# Patient Record
Sex: Male | Born: 1985 | Race: White | Hispanic: No | Marital: Married | Smoking: Never smoker
Health system: Southern US, Community
[De-identification: ages and names within clinical notes are randomized; demographics above are authoritative.]

## PROBLEM LIST (undated history)

## (undated) DIAGNOSIS — K37 Unspecified appendicitis: Secondary | ICD-10-CM

## (undated) DIAGNOSIS — K219 Gastro-esophageal reflux disease without esophagitis: Secondary | ICD-10-CM

## (undated) DIAGNOSIS — K449 Diaphragmatic hernia without obstruction or gangrene: Secondary | ICD-10-CM

## (undated) DIAGNOSIS — K589 Irritable bowel syndrome without diarrhea: Secondary | ICD-10-CM

## (undated) HISTORY — PX: KNEE SURGERY: SHX244

## (undated) HISTORY — PX: NOSE SURGERY: SHX723

## (undated) HISTORY — PX: APPENDECTOMY: SHX54

## (undated) HISTORY — PX: VASECTOMY: SHX75

---

## 2009-02-03 ENCOUNTER — Emergency Department (HOSPITAL_BASED_OUTPATIENT_CLINIC_OR_DEPARTMENT_OTHER): Admission: EM | Admit: 2009-02-03 | Discharge: 2009-02-03 | Payer: Self-pay | Admitting: Emergency Medicine

## 2009-05-13 ENCOUNTER — Ambulatory Visit: Payer: Self-pay | Admitting: Family Medicine

## 2009-05-13 DIAGNOSIS — R110 Nausea: Secondary | ICD-10-CM | POA: Insufficient documentation

## 2009-05-13 DIAGNOSIS — J029 Acute pharyngitis, unspecified: Secondary | ICD-10-CM | POA: Insufficient documentation

## 2009-08-25 ENCOUNTER — Emergency Department (HOSPITAL_BASED_OUTPATIENT_CLINIC_OR_DEPARTMENT_OTHER): Admission: EM | Admit: 2009-08-25 | Discharge: 2009-08-25 | Payer: Self-pay | Admitting: Emergency Medicine

## 2009-11-19 ENCOUNTER — Ambulatory Visit: Payer: Self-pay | Admitting: Family Medicine

## 2009-11-19 DIAGNOSIS — R519 Headache, unspecified: Secondary | ICD-10-CM | POA: Insufficient documentation

## 2009-11-19 DIAGNOSIS — J039 Acute tonsillitis, unspecified: Secondary | ICD-10-CM

## 2009-11-19 DIAGNOSIS — R51 Headache: Secondary | ICD-10-CM

## 2009-11-20 ENCOUNTER — Encounter: Payer: Self-pay | Admitting: Family Medicine

## 2010-12-03 NOTE — Assessment & Plan Note (Signed)
Summary: HA/TM   Vital Signs:  Patient Profile:   25 Years Old Male CC:      Headache, sore throat x 2 weeks, vomitng x 2 days Height:     74 inches Weight:      220 pounds O2 Sat:      97 % O2 treatment:    Room Air Temp:     97.0 degrees F oral Pulse rate:   88 / minute Pulse rhythm:   regular Resp:     12 per minute BP sitting:   126 / 79  (right arm) Cuff size:   regular  Vitals Entered By: Emilio Math (November 19, 2009 10:17 AM)                  Current Allergies (reviewed today): No known allergies History of Present Illness Chief Complaint: Headache, sore throat x 2 weeks, vomitng x 2 days History of Present Illness: Subjective:  Patient complains of a persistent sore throat for 2 weeks.  He reports a long history of occasional tonsil stones which have become more pronounced during this time.  He has a history of IBS, and over the past two days has had increased nausea without vomiting.  No fevers, chills, and sweats.  He has had sinus congestion with clear mucous drainage but no cough.   Last night he developed a severe frontal/generalized headache which has persisted, but has had no neuro symptoms.  He has had a similar headache about every 2 to 3 days.  His mother has migraine headaches.  He has been under increased stress recently with both parents in the hospital.  REVIEW OF SYSTEMS Constitutional Symptoms      Denies fever, chills, night sweats, weight loss, weight gain, and fatigue.  Eyes       Denies change in vision, eye pain, eye discharge, glasses, contact lenses, and eye surgery. Ear/Nose/Throat/Mouth       Complains of sore throat.      Denies hearing loss/aids, change in hearing, ear pain, ear discharge, dizziness, frequent runny nose, frequent nose bleeds, sinus problems, hoarseness, and tooth pain or bleeding.  Respiratory       Denies dry cough, productive cough, wheezing, shortness of breath, asthma, bronchitis, and emphysema/COPD.  Cardiovascular      Denies murmurs, chest pain, and tires easily with exhertion.    Gastrointestinal       Complains of nausea/vomiting.      Denies stomach pain, diarrhea, constipation, blood in bowel movements, and indigestion. Genitourniary       Denies painful urination, kidney stones, and loss of urinary control. Neurological       Complains of headaches.      Denies paralysis, seizures, and fainting/blackouts. Musculoskeletal       Denies muscle pain, joint pain, joint stiffness, decreased range of motion, redness, swelling, muscle weakness, and gout.  Skin       Denies bruising, unusual mles/lumps or sores, and hair/skin or nail changes.  Psych       Denies mood changes, temper/anger issues, anxiety/stress, speech problems, depression, and sleep problems.  Past History:  Past Medical History: Reviewed history from 05/13/2009 and no changes required. Unremarkable  Past Surgical History: Reviewed history from 05/13/2009 and no changes required. ACL repair  Family History: Reviewed history from 05/13/2009 and no changes required. noncontributory  Social History: Reviewed history from 05/13/2009 and no changes required. Occupation:field work for [employer] Married Former Smoker Alcohol use-no Drug use-no   Objective:  Appearance:  Patient appears healthy, stated age, and in no acute distress  Eyes:  Pupils are equal, round, and reactive to light and accomdation.  Extraocular movement is intact.  Conjunctivae are not inflamed.  Fundi are benign.  No photophobia Ears:  Canals normal.  Tympanic membranes normal.   Nose:  Normal septum.  Normal turbinates, mildly congested.  No sinus tenderness present.  Pharynx:  Erythematous and slightly swollen (worse on right) without obstruction.  Minimal exudate.  Neck:  Supple.  Slightly tender shotty anterior nodes are palpated bilaterally.  Lungs:  Clear to auscultation.  Breath sounds are equal.  Heart:  Regular rate and rhythm without murmurs,  rubs, or gallops.  Abdomen:  Nontender without masses or hepatosplenomegaly.  Bowel sounds are present.  No CVA or flank tenderness.  Neurologic:  Cranial nerves 2 through 12 are normal.  Patellar  reflexes are normal.  Cerebellar function is intact.  Gait and station are normal.  Grip strength symmetric bilaterally.  Assessment New Problems: HEADACHE (ICD-784.0) ACUTE TONSILLITIS (ICD-463)  PERSISTENT TONSILLITIS WITH TONSILLOLITHS PRESENT MIGRAINE HEADACHE  Plan New Medications/Changes: ISOMETHEPTENE-APAP-DICHLORAL 65-325-100 MG CAPS (APAP-ISOMETHEPTENE-DICHLORAL) 1 or 2 by mouth q4hr as needed headache.  Max 8 caps/day  #12 x 1, 11/19/2009, Donna Christen MD AMOXICILLIN 875 MG TABS (AMOXICILLIN) One by mouth two times a day  #20 x 0, 11/19/2009, Donna Christen MD  New Orders: T-Culture, Throat [[id number]] Est. Patient Level IV [09811] Planning Comments:   Patient declines Toradol injection. Begin trial of Midrin.  Rest and increased fluids today. Throat culture pending.  Begin amoxicillin for 10 days. Given info sheet on tonsilloliths.  Recommend ENT evaluation Return for worsening symptoms   The patient and/or caregiver has been counseled thoroughly with regard to medications prescribed including dosage, schedule, interactions, rationale for use, and possible side effects and they verbalize understanding.  Diagnoses and expected course of recovery discussed and will return if not improved as expected or if the condition worsens. Patient and/or caregiver verbalized understanding.  Prescriptions: ISOMETHEPTENE-APAP-DICHLORAL 65-325-100 MG CAPS (APAP-ISOMETHEPTENE-DICHLORAL) 1 or 2 by mouth q4hr as needed headache.  Max 8 caps/day  #12 x 1   Entered and Authorized by:   Donna Christen MD   Signed by:   Donna Christen MD on 11/19/2009   Method used:   Print then Give to Patient   RxID:   [id number] AMOXICILLIN 875 MG TABS (AMOXICILLIN) One by mouth two times a day  #20 x 0    Entered and Authorized by:   Donna Christen MD   Signed by:   Donna Christen MD on 11/19/2009   Method used:   Print then Give to Patient   RxID:   [id number]

## 2010-12-03 NOTE — Letter (Signed)
Summary: Out of Work  [facility]  [address]   [place], [place] [address]   Phone: [phone]  Fax: [phone]    November 19, 2009   Employee:  Theodore Walker    To Whom It May Concern:   For Medical reasons, please excuse the above named employee from work today.    If you need additional information, please feel free to contact our office.         Sincerely,    Donna Christen MD

## 2011-02-06 LAB — URINALYSIS, ROUTINE W REFLEX MICROSCOPIC
Glucose, UA: NEGATIVE mg/dL
Ketones, ur: 15 mg/dL — AB
Leukocytes, UA: NEGATIVE
Nitrite: NEGATIVE
pH: 6 (ref 5.0–8.0)

## 2011-02-06 LAB — CBC
HCT: 39.7 % (ref 39.0–52.0)
MCV: 91.7 fL (ref 78.0–100.0)
Platelets: 236 10*3/uL (ref 150–400)
RDW: 13 % (ref 11.5–15.5)

## 2011-02-06 LAB — URINE MICROSCOPIC-ADD ON

## 2011-02-06 LAB — COMPREHENSIVE METABOLIC PANEL
Albumin: 4.7 g/dL (ref 3.5–5.2)
BUN: 8 mg/dL (ref 6–23)
Creatinine, Ser: 1 mg/dL (ref 0.4–1.5)
Total Bilirubin: 0.7 mg/dL (ref 0.3–1.2)
Total Protein: 7.8 g/dL (ref 6.0–8.3)

## 2011-02-06 LAB — DIFFERENTIAL
Basophils Absolute: 0.3 10*3/uL — ABNORMAL HIGH (ref 0.0–0.1)
Lymphocytes Relative: 11 % — ABNORMAL LOW (ref 12–46)
Monocytes Absolute: 1.5 10*3/uL — ABNORMAL HIGH (ref 0.1–1.0)
Monocytes Relative: 14 % — ABNORMAL HIGH (ref 3–12)
Neutro Abs: 7.4 10*3/uL (ref 1.7–7.7)

## 2011-02-06 LAB — RAPID STREP SCREEN (MED CTR MEBANE ONLY): Streptococcus, Group A Screen (Direct): NEGATIVE

## 2012-04-20 ENCOUNTER — Emergency Department (HOSPITAL_BASED_OUTPATIENT_CLINIC_OR_DEPARTMENT_OTHER)
Admission: EM | Admit: 2012-04-20 | Discharge: 2012-04-20 | Disposition: A | Discharge status: Home / Self Care | Payer: Medicaid Other | Attending: Emergency Medicine | Admitting: Emergency Medicine

## 2012-04-20 ENCOUNTER — Encounter (HOSPITAL_BASED_OUTPATIENT_CLINIC_OR_DEPARTMENT_OTHER): Payer: Self-pay | Admitting: *Deleted

## 2012-04-20 ENCOUNTER — Emergency Department (HOSPITAL_BASED_OUTPATIENT_CLINIC_OR_DEPARTMENT_OTHER): Payer: Medicaid Other

## 2012-04-20 DIAGNOSIS — S8990XA Unspecified injury of unspecified lower leg, initial encounter: Secondary | ICD-10-CM | POA: Insufficient documentation

## 2012-04-20 DIAGNOSIS — Y998 Other external cause status: Secondary | ICD-10-CM | POA: Insufficient documentation

## 2012-04-20 DIAGNOSIS — S99929A Unspecified injury of unspecified foot, initial encounter: Secondary | ICD-10-CM | POA: Insufficient documentation

## 2012-04-20 DIAGNOSIS — Y9389 Activity, other specified: Secondary | ICD-10-CM | POA: Insufficient documentation

## 2012-04-20 DIAGNOSIS — M25462 Effusion, left knee: Secondary | ICD-10-CM

## 2012-04-20 DIAGNOSIS — K219 Gastro-esophageal reflux disease without esophagitis: Secondary | ICD-10-CM | POA: Insufficient documentation

## 2012-04-20 DIAGNOSIS — IMO0002 Reserved for concepts with insufficient information to code with codable children: Secondary | ICD-10-CM | POA: Insufficient documentation

## 2012-04-20 HISTORY — DX: Gastro-esophageal reflux disease without esophagitis: K21.9

## 2012-04-20 MED ORDER — PROMETHAZINE HCL 25 MG PO TABS: 25.0000 mg | ORAL_TABLET | Freq: Four times a day (QID) | ORAL | Status: DC | PRN

## 2012-04-20 MED ORDER — OXYCODONE-ACETAMINOPHEN 5-325 MG PO TABS: 2.0000 | ORAL_TABLET | ORAL | Status: AC | PRN

## 2012-04-20 MED ORDER — IBUPROFEN 800 MG PO TABS
800.0000 mg | ORAL_TABLET | Freq: Once | ORAL | Status: AC
  Administered 2012-04-20: 800 mg via ORAL
  Filled 2012-04-20: qty 1

## 2012-04-20 NOTE — ED Provider Notes (Signed)
History     CSN: [id number]  Arrival date & time 04/20/12  2725   First MD Initiated Contact with Patient 04/20/12 1931      Chief Complaint  Patient presents with  . Knee Injury    (Consider location/radiation/quality/duration/timing/severity/associated sxs/prior treatment) Patient is a 26 y.o. male presenting with knee pain. The history is provided by the patient. No language interpreter was used.  Knee Pain This is a new problem. The current episode started today. The problem occurs constantly. The problem has been unchanged. Associated symptoms include joint swelling and myalgias. The symptoms are aggravated by bending and walking. He has tried nothing for the symptoms. The treatment provided no relief.  Pt reports he turned a golf cart over on to his knee.   Pt complains of swelling and pain  Past Medical History  Diagnosis Date  . GERD (gastroesophageal reflux disease)     History reviewed. No pertinent past surgical history.  History reviewed. No pertinent family history.  History  Substance Use Topics  . Smoking status: Not on file  . Smokeless tobacco: Current User  . Alcohol Use: No      Review of Systems  Musculoskeletal: Positive for myalgias and joint swelling.  All other systems reviewed and are negative.    Allergies  Morphine and related  Home Medications  No current outpatient prescriptions on file.  BP 124/61  Pulse 65  Temp 97.9 F (36.6 C) (Oral)  Resp 16  Ht 6\' 3"  (1.905 m)  Wt 240 lb (108.863 kg)  BMI 30.00 kg/m2  SpO2 100%  Physical Exam  Nursing note and vitals reviewed. Constitutional: He is oriented to person, place, and time. He appears well-developed and well-nourished.  Musculoskeletal: He exhibits edema and tenderness.       Swollen tender left knee,  Decreased range of motion,  nv and ns intact  Neurological: He is alert and oriented to person, place, and time. He has normal reflexes.  Skin: Skin is warm.  Psychiatric:  He has a normal mood and affect.    ED Course  Procedures (including critical care time)  Labs Reviewed - No data to display Dg Knee Complete 4 Views Left  04/20/2012  *RADIOLOGY REPORT*  Clinical Data: Injured left knee.  History of left knee surgery x2.  LEFT KNEE - COMPLETE 4+ VIEW  Comparison: None.  Findings: No evidence of acute or subacute fracture or dislocation. Well-preserved joint spaces.  Well-circumscribed lesion in the lateral aspect of the distal femoral metaphysis.  No other intrinsic osseous abnormality.  Small joint effusion suspected.  IMPRESSION: No acute osseous abnormality.  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  Small joint effusion suspected.  Original Report Authenticated By: Arnell Sieving, M.D.     No diagnosis found.    MDM  Pt placed in a knee imbolizer,  Pt given rx for percocet I advised follow up with dr. Rennis Chris for evaluation        Elson Areas, [place] 04/20/12 2032

## 2012-04-20 NOTE — Discharge Instructions (Signed)

## 2012-04-20 NOTE — ED Notes (Signed)
Pt c/o left knee pain after injury this am

## 2012-04-21 NOTE — ED Provider Notes (Signed)
Medical screening examination/treatment/procedure(s) were performed by non-physician practitioner and as supervising physician I was immediately available for consultation/collaboration.   Eyla Tallon, MD 04/21/12 2312 

## 2012-04-26 ENCOUNTER — Other Ambulatory Visit: Payer: Self-pay | Admitting: Orthopedic Surgery

## 2012-04-26 DIAGNOSIS — M25559 Pain in unspecified hip: Secondary | ICD-10-CM

## 2012-04-28 ENCOUNTER — Inpatient Hospital Stay: Admission: RE | Admit: 2012-04-28 | Payer: Self-pay | Source: Ambulatory Visit

## 2012-04-28 ENCOUNTER — Other Ambulatory Visit: Payer: Self-pay

## 2012-05-22 ENCOUNTER — Encounter (HOSPITAL_BASED_OUTPATIENT_CLINIC_OR_DEPARTMENT_OTHER): Payer: Self-pay | Admitting: *Deleted

## 2012-05-22 ENCOUNTER — Emergency Department (HOSPITAL_BASED_OUTPATIENT_CLINIC_OR_DEPARTMENT_OTHER): Payer: Managed Care, Other (non HMO)

## 2012-05-22 ENCOUNTER — Emergency Department (HOSPITAL_BASED_OUTPATIENT_CLINIC_OR_DEPARTMENT_OTHER)
Admission: EM | Admit: 2012-05-22 | Discharge: 2012-05-22 | Disposition: A | Discharge status: Home / Self Care | Payer: Managed Care, Other (non HMO) | Attending: Emergency Medicine | Admitting: Emergency Medicine

## 2012-05-22 DIAGNOSIS — R599 Enlarged lymph nodes, unspecified: Secondary | ICD-10-CM | POA: Insufficient documentation

## 2012-05-22 DIAGNOSIS — K625 Hemorrhage of anus and rectum: Secondary | ICD-10-CM | POA: Insufficient documentation

## 2012-05-22 DIAGNOSIS — K589 Irritable bowel syndrome without diarrhea: Secondary | ICD-10-CM | POA: Insufficient documentation

## 2012-05-22 DIAGNOSIS — K219 Gastro-esophageal reflux disease without esophagitis: Secondary | ICD-10-CM | POA: Insufficient documentation

## 2012-05-22 DIAGNOSIS — R591 Generalized enlarged lymph nodes: Secondary | ICD-10-CM

## 2012-05-22 HISTORY — DX: Diaphragmatic hernia without obstruction or gangrene: K44.9

## 2012-05-22 HISTORY — DX: Irritable bowel syndrome, unspecified: K58.9

## 2012-05-22 LAB — CBC WITH DIFFERENTIAL/PLATELET
Basophils Relative: 1 % (ref 0–1)
Eosinophils Absolute: 0.4 10*3/uL (ref 0.0–0.7)
Eosinophils Relative: 4 % (ref 0–5)
Hemoglobin: 13.2 g/dL (ref 13.0–17.0)
Lymphs Abs: 2.1 10*3/uL (ref 0.7–4.0)
MCH: 30.8 pg (ref 26.0–34.0)
MCHC: 35.2 g/dL (ref 30.0–36.0)
MCV: 87.6 fL (ref 78.0–100.0)
Monocytes Relative: 9 % (ref 3–12)
RBC: 4.28 MIL/uL (ref 4.22–5.81)

## 2012-05-22 LAB — COMPREHENSIVE METABOLIC PANEL
BUN: 7 mg/dL (ref 6–23)
Calcium: 9.6 mg/dL (ref 8.4–10.5)
Creatinine, Ser: 1 mg/dL (ref 0.50–1.35)
GFR calc Af Amer: >90 mL/min (ref >90)
Glucose, Bld: 70 mg/dL (ref 70–99)
Total Protein: 7.7 g/dL (ref 6.0–8.3)

## 2012-05-22 MED ORDER — ONDANSETRON HCL 4 MG/2ML IJ SOLN
4.0000 mg | Freq: Once | INTRAMUSCULAR | Status: AC
  Administered 2012-05-22: 4 mg via INTRAVENOUS
  Filled 2012-05-22: qty 2

## 2012-05-22 MED ORDER — HYDROMORPHONE HCL PF 1 MG/ML IJ SOLN
1.0000 mg | Freq: Once | INTRAMUSCULAR | Status: AC
  Administered 2012-05-22: 1 mg via INTRAVENOUS
  Filled 2012-05-22: qty 1

## 2012-05-22 MED ORDER — IOHEXOL 300 MG/ML  SOLN: 20.0000 mL | INTRAMUSCULAR | Status: DC

## 2012-05-22 MED ORDER — GI COCKTAIL ~~LOC~~
30.0000 mL | Freq: Once | ORAL | Status: AC
  Administered 2012-05-22: 30 mL via ORAL
  Filled 2012-05-22: qty 30

## 2012-05-22 MED ORDER — HYDROCODONE-ACETAMINOPHEN 5-500 MG PO TABS: 1.0000 | ORAL_TABLET | Freq: Four times a day (QID) | ORAL | Status: AC | PRN

## 2012-05-22 MED ORDER — IOHEXOL 300 MG/ML  SOLN
100.0000 mL | Freq: Once | INTRAMUSCULAR | Status: AC | PRN
  Administered 2012-05-22: 100 mL via INTRAVENOUS

## 2012-05-22 NOTE — ED Provider Notes (Signed)
History     CSN: [id number]  Arrival date & time 05/22/12  1428   First MD Initiated Contact with Patient 05/22/12 1449      Chief Complaint  Patient presents with  . Rectal Bleeding    (Consider location/radiation/quality/duration/timing/severity/associated sxs/prior treatment) Patient is a 26 y.o. male presenting with hematochezia. The history is provided by the patient. No language interpreter was used.  Rectal Bleeding  The current episode started today. The onset was gradual. The problem occurs occasionally. The problem has been unchanged. The pain is moderate. The stool is described as liquid and bloody. Pertinent negatives include no fever and no nausea.    Past Medical History  Diagnosis Date  . GERD (gastroesophageal reflux disease)   . IBS (irritable bowel syndrome)   . Hiatal hernia   . Spastic colon     Past Surgical History  Procedure Date  . Knee surgery   . Nose surgery   . Vasectomy     History reviewed. No pertinent family history.  History  Substance Use Topics  . Smoking status: Not on file  . Smokeless tobacco: Current User  . Alcohol Use: No      Review of Systems  Constitutional: Negative for fever.  Respiratory: Negative.   Cardiovascular: Negative.   Gastrointestinal: Positive for hematochezia. Negative for nausea.  Neurological: Negative.     Allergies  Morphine and related  Home Medications   Current Outpatient Rx  Name Route Sig Dispense Refill  . OMEPRAZOLE 20 MG PO CPDR Oral Take 40 mg by mouth daily.    Marland Kitchen PROMETHAZINE HCL 25 MG PO TABS Oral Take 1 tablet (25 mg total) by mouth every 6 (six) hours as needed for nausea. 20 tablet 0    BP 125/62  Pulse 78  Temp 97.5 F (36.4 C) (Oral)  Resp 20  Ht 6\' 3"  (1.905 m)  Wt 230 lb (104.327 kg)  BMI 28.75 kg/m2  SpO2 100%  Physical Exam  Nursing note and vitals reviewed. Constitutional: He is oriented to person, place, and time. He appears well-developed and  well-nourished.  HENT:  Head: Atraumatic.  Eyes: Conjunctivae and EOM are normal.  Neck: Neck supple.  Cardiovascular: Normal rate and regular rhythm.   Pulmonary/Chest: Effort normal and breath sounds normal.  Abdominal: Soft. Bowel sounds are normal.       Lower abdominal tenderness  Genitourinary:       Bright red blood  Musculoskeletal: Normal range of motion.  Neurological: He is alert and oriented to person, place, and time.  Skin: Skin is warm and dry.  Psychiatric: He has a normal mood and affect.    ED Course  Procedures (including critical care time)  Labs Reviewed  CBC WITH DIFFERENTIAL - Abnormal; Notable for the following:    HCT 37.5 (*)     All other components within normal limits  COMPREHENSIVE METABOLIC PANEL - Abnormal; Notable for the following:    Total Bilirubin 0.2 (*)     All other components within normal limits  OCCULT BLOOD X 1 CARD TO LAB, STOOL   Ct Abdomen Pelvis W Contrast  05/22/2012  *RADIOLOGY REPORT*  Clinical Data: Rectal bleeding, abdominal pain  CT ABDOMEN AND PELVIS WITH CONTRAST  Technique:  Multidetector CT imaging of the abdomen and pelvis was performed following the standard protocol during bolus administration of intravenous contrast.  Contrast: 1 OMNIPAQUE IOHEXOL 300 MG/ML  SOLN, OMNIPAQUE IOHEXOL 300 MG/ML  SOLN  Comparison: None.  Findings: Lung bases  are clear.  Small hiatal hernia.  Two associated paraesophageal lymph nodes near the GE junction measuring 12 mm (series 2/images 1 and 7).  Liver, spleen, pancreas, and adrenal glands are within normal limits.  Gallbladder is unremarkable.  No intrahepatic or extrahepatic ductal dilatation.  Kidneys are within normal limits.  No hydronephrosis.  No evidence of bowel obstruction.  Normal appendix.  Mild sigmoid diverticulosis.  No colonic wall thickening or inflammatory changes.  No evidence of abdominal aortic aneurysm.  No abdominopelvic ascites.  No suspicious abdominopelvic  lymphadenopathy.  Prostate is unremarkable.  Bladder is within normal limits.  Visualized osseous structures are within normal limits.  IMPRESSION: Mild sigmoid diverticulosis.  No colonic wall thickening or inflammatory changes.  No evidence of bowel obstruction.  Normal appendix.  Small hiatal hernia.  Two associated 12 mm paraesophageal lymph nodes near the GE junction.  Consider upper endoscopy for further evaluation.  Original Report Authenticated By: Charline Bills, M.D.     1. Rectal bleeding   2. Lymphadenopathy       MDM  Discussed findings of ctwith pt:pt blood not symptomatic with bleeding hgb normal:feel like pt is safe to follow up with his gi doctor in [place], [place] 05/22/12 [phone]

## 2012-05-22 NOTE — ED Notes (Addendum)
Pt describes abd pain and bright red bleeding from rectum when he uses the bathroom "alot" Denies itching or other s/s. Hx IBS and spastic colon.

## 2012-05-23 NOTE — ED Provider Notes (Signed)
Medical screening examination/treatment/procedure(s) were performed by non-physician practitioner and as supervising physician I was immediately available for consultation/collaboration.   Gwyneth Sprout, MD 05/23/12 [phone]

## 2012-08-28 ENCOUNTER — Emergency Department (HOSPITAL_BASED_OUTPATIENT_CLINIC_OR_DEPARTMENT_OTHER): Payer: Managed Care, Other (non HMO)

## 2012-08-28 ENCOUNTER — Encounter (HOSPITAL_BASED_OUTPATIENT_CLINIC_OR_DEPARTMENT_OTHER): Payer: Self-pay | Admitting: *Deleted

## 2012-08-28 ENCOUNTER — Emergency Department (HOSPITAL_BASED_OUTPATIENT_CLINIC_OR_DEPARTMENT_OTHER)
Admission: EM | Admit: 2012-08-28 | Discharge: 2012-08-28 | Disposition: A | Discharge status: Home / Self Care | Payer: Managed Care, Other (non HMO) | Attending: Emergency Medicine | Admitting: Emergency Medicine

## 2012-08-28 DIAGNOSIS — Z79899 Other long term (current) drug therapy: Secondary | ICD-10-CM | POA: Insufficient documentation

## 2012-08-28 DIAGNOSIS — S46919A Strain of unspecified muscle, fascia and tendon at shoulder and upper arm level, unspecified arm, initial encounter: Secondary | ICD-10-CM

## 2012-08-28 DIAGNOSIS — IMO0002 Reserved for concepts with insufficient information to code with codable children: Secondary | ICD-10-CM | POA: Insufficient documentation

## 2012-08-28 DIAGNOSIS — Y9389 Activity, other specified: Secondary | ICD-10-CM | POA: Insufficient documentation

## 2012-08-28 DIAGNOSIS — F17200 Nicotine dependence, unspecified, uncomplicated: Secondary | ICD-10-CM | POA: Insufficient documentation

## 2012-08-28 DIAGNOSIS — Y929 Unspecified place or not applicable: Secondary | ICD-10-CM | POA: Insufficient documentation

## 2012-08-28 DIAGNOSIS — Z8719 Personal history of other diseases of the digestive system: Secondary | ICD-10-CM | POA: Insufficient documentation

## 2012-08-28 DIAGNOSIS — R296 Repeated falls: Secondary | ICD-10-CM | POA: Insufficient documentation

## 2012-08-28 MED ORDER — OXYCODONE-ACETAMINOPHEN 5-325 MG PO TABS
2.0000 | ORAL_TABLET | Freq: Once | ORAL | Status: AC
  Administered 2012-08-28: 2 via ORAL
  Filled 2012-08-28 (×2): qty 2

## 2012-08-28 MED ORDER — OXYCODONE-ACETAMINOPHEN 5-325 MG PO TABS: 2.0000 | ORAL_TABLET | ORAL | Status: DC | PRN

## 2012-08-28 NOTE — ED Notes (Signed)
Pt presents to ED today without indwelling IV catheter.  Not charted as removed from previous EPIC encounter.  

## 2012-08-28 NOTE — ED Notes (Signed)
Pt has decreased movement to right shoulder after fall.  Pain increased with palpation.  Pt has no obvious deformities noted.

## 2012-08-28 NOTE — ED Notes (Signed)
Pt presents to ED today with right shoulder injury after playing with kids and falling fron standing landing on tile/

## 2012-08-28 NOTE — ED Provider Notes (Signed)
History     CSN: [id number]  Arrival date & time 08/28/12  7829   First MD Initiated Contact with Patient 08/28/12 1004      Chief Complaint  Patient presents with  . Shoulder Injury    (Consider location/radiation/quality/duration/timing/severity/associated sxs/prior treatment) HPI Comments: Patient presents with pain to his right shoulder that he sustained after an injury yesterday. He's had a constant throbbing pain in his right shoulder since then. He states he was playing with his kids and he fell over or leaning directly on his right shoulder. He has no history of prior injuries to the shoulder. Denies any numbness or weakness in his hand. Denies any other injuries. No neck or back pain. Denies any head injury. He's taking over-the-counter medicines without relief.  Patient is a 26 y.o. male presenting with shoulder injury. The history is provided by the patient.  Shoulder Injury Pertinent negatives include no chest pain, no headaches and no shortness of breath.    Past Medical History  Diagnosis Date  . GERD (gastroesophageal reflux disease)   . IBS (irritable bowel syndrome)   . Hiatal hernia   . Spastic colon     Past Surgical History  Procedure Date  . Knee surgery   . Nose surgery   . Vasectomy     History reviewed. No pertinent family history.  History  Substance Use Topics  . Smoking status: Not on file  . Smokeless tobacco: Current User  . Alcohol Use: No      Review of Systems  Constitutional: Negative for fatigue.  HENT: Negative for neck pain.   Eyes: Negative.   Respiratory: Negative for shortness of breath.   Cardiovascular: Negative for chest pain.  Gastrointestinal: Negative for nausea and vomiting.  Genitourinary: Negative for frequency, hematuria, flank pain and difficulty urinating.  Musculoskeletal: Negative for back pain.  Skin: Negative for rash.  Neurological: Negative for weakness, numbness and headaches.    Allergies    Morphine and related  Home Medications   Current Outpatient Rx  Name Route Sig Dispense Refill  . OXYCODONE-ACETAMINOPHEN 5-325 MG PO TABS Oral Take 2 tablets by mouth every 4 (four) hours as needed for pain. 15 tablet 0  . PROMETHAZINE HCL 25 MG PO TABS Oral Take 1 tablet (25 mg total) by mouth every 6 (six) hours as needed for nausea. 20 tablet 0    BP 122/72  Pulse 80  Temp 98.2 F (36.8 C) (Oral)  Resp 14  SpO2 100%  Physical Exam  Constitutional: He is oriented to person, place, and time. He appears well-developed and well-nourished.  HENT:  Head: Normocephalic and atraumatic.  Neck: Normal range of motion. Neck supple.       No pain to neck or back  Musculoskeletal: He exhibits edema and tenderness.       He has moderate tenderness to the anterior and posterior right shoulder. He also has some tenderness around the a.c. joint. He has pain on any range of motion of the shoulder of the shoulder joint. Denies any pain to the elbow or the hand. He has normal sensation in the hand. Normal motor function in the hand. Radial pulses are intact. No deformity or swelling is noted.  Neurological: He is alert and oriented to person, place, and time.    ED Course  Procedures (including critical care time)  Dg Shoulder Right  08/28/2012  *RADIOLOGY REPORT*  Clinical Data: Right shoulder injury.  Pain.  Limited range of motion.  RIGHT SHOULDER -  2+ VIEW  Comparison: None available.  Findings: The right shoulder is located.  No acute bone or soft tissue abnormality is present.  The right hemithorax is clear.  IMPRESSION: Negative right shoulder.   Original Report Authenticated By: Jamesetta Orleans. MATTERN, M.D.       1. Shoulder strain       MDM  There is no evidence of fracture or dislocation. Patient is given a shoulder sling to use it was advised to take his are not periodically and do range of motion exercises. Was given a prescription for Percocet for pain. He was given a  referral to follow up with an orthopedist if his symptoms are not improving or return here as needed for any worsening symptoms        Rolan Bucco, MD 08/28/12 1324

## 2012-09-22 ENCOUNTER — Encounter (HOSPITAL_BASED_OUTPATIENT_CLINIC_OR_DEPARTMENT_OTHER): Payer: Self-pay | Admitting: Emergency Medicine

## 2012-09-22 ENCOUNTER — Emergency Department (HOSPITAL_BASED_OUTPATIENT_CLINIC_OR_DEPARTMENT_OTHER)
Admission: EM | Admit: 2012-09-22 | Discharge: 2012-09-22 | Disposition: A | Discharge status: Home / Self Care | Payer: Medicaid Other | Attending: Emergency Medicine | Admitting: Emergency Medicine

## 2012-09-22 ENCOUNTER — Emergency Department (HOSPITAL_BASED_OUTPATIENT_CLINIC_OR_DEPARTMENT_OTHER): Payer: Medicaid Other

## 2012-09-22 DIAGNOSIS — Z8719 Personal history of other diseases of the digestive system: Secondary | ICD-10-CM | POA: Insufficient documentation

## 2012-09-22 DIAGNOSIS — R1031 Right lower quadrant pain: Secondary | ICD-10-CM | POA: Insufficient documentation

## 2012-09-22 DIAGNOSIS — R109 Unspecified abdominal pain: Secondary | ICD-10-CM

## 2012-09-22 DIAGNOSIS — G8929 Other chronic pain: Secondary | ICD-10-CM | POA: Insufficient documentation

## 2012-09-22 HISTORY — DX: Unspecified appendicitis: K37

## 2012-09-22 LAB — BASIC METABOLIC PANEL
CO2: 26 mEq/L (ref 19–32)
Chloride: 99 mEq/L (ref 96–112)
Creatinine, Ser: 0.9 mg/dL (ref 0.50–1.35)

## 2012-09-22 LAB — CBC WITH DIFFERENTIAL/PLATELET
Basophils Absolute: 0.1 10*3/uL (ref 0.0–0.1)
HCT: 39.7 % (ref 39.0–52.0)
Hemoglobin: 14.1 g/dL (ref 13.0–17.0)
Lymphocytes Relative: 29 % (ref 12–46)
Lymphs Abs: 2.7 10*3/uL (ref 0.7–4.0)
Monocytes Absolute: 0.7 10*3/uL (ref 0.1–1.0)
Monocytes Relative: 8 % (ref 3–12)
Neutro Abs: 5.6 10*3/uL (ref 1.7–7.7)
RBC: 4.64 MIL/uL (ref 4.22–5.81)
RDW: 12.3 % (ref 11.5–15.5)
WBC: 9.2 10*3/uL (ref 4.0–10.5)

## 2012-09-22 MED ORDER — HYDROMORPHONE HCL PF 1 MG/ML IJ SOLN
1.0000 mg | Freq: Once | INTRAMUSCULAR | Status: AC
  Administered 2012-09-22: 1 mg via INTRAVENOUS
  Filled 2012-09-22: qty 1

## 2012-09-22 MED ORDER — ONDANSETRON HCL 4 MG/2ML IJ SOLN
4.0000 mg | Freq: Once | INTRAMUSCULAR | Status: AC
  Administered 2012-09-22: 4 mg via INTRAVENOUS
  Filled 2012-09-22: qty 2

## 2012-09-22 MED ORDER — IOHEXOL 300 MG/ML  SOLN: 25.0000 mL | INTRAMUSCULAR | Status: DC

## 2012-09-22 MED ORDER — IOHEXOL 300 MG/ML  SOLN
100.0000 mL | Freq: Once | INTRAMUSCULAR | Status: AC | PRN
  Administered 2012-09-22: 100 mL via INTRAVENOUS

## 2012-09-22 NOTE — ED Notes (Signed)
Patient transported to CT 

## 2012-09-22 NOTE — ED Notes (Signed)
MD at bedside. 

## 2012-09-22 NOTE — ED Provider Notes (Signed)
History     CSN: [id number]  Arrival date & time 09/22/12  1478   First MD Initiated Contact with Patient 09/22/12 1845      Chief Complaint  Patient presents with  . Abdominal Pain    (Consider location/radiation/quality/duration/timing/severity/associated sxs/prior treatment) HPI Pt with history of IBS is approx a week s/p laparoscopic appendectomy. Reports continued severe aching RLQ pain and vomiting not improved with pain medications at home. He denies any fever, no constipation, no dysuria or hematuria. He was advised by his Surgeon at [facility] to come to the ED for evaluation.   Past Medical History  Diagnosis Date  . GERD (gastroesophageal reflux disease)   . IBS (irritable bowel syndrome)   . Hiatal hernia   . Spastic colon   . Appendicitis     Past Surgical History  Procedure Date  . Knee surgery   . Nose surgery   . Vasectomy   . Appendectomy     No family history on file.  History  Substance Use Topics  . Smoking status: Never Smoker   . Smokeless tobacco: Current User    Types: Chew  . Alcohol Use: No      Review of Systems All other systems reviewed and are negative except as noted in HPI.   Allergies  Morphine and related  Home Medications   Current Outpatient Rx  Name  Route  Sig  Dispense  Refill  . HYDROCODONE-ACETAMINOPHEN 5-325 MG PO TABS   Oral   Take 2 tablets by mouth every 6 (six) hours as needed.         . OXYCODONE-ACETAMINOPHEN 5-325 MG PO TABS   Oral   Take 2 tablets by mouth every 4 (four) hours as needed for pain.   15 tablet   0   . PROMETHAZINE HCL 25 MG PO TABS   Oral   Take 1 tablet (25 mg total) by mouth every 6 (six) hours as needed for nausea.   20 tablet   0     BP 123/84  Pulse 94  Temp 97.9 F (36.6 C) (Oral)  Resp 16  Ht 6\' 3"  (1.905 m)  Wt 230 lb (104.327 kg)  BMI 28.75 kg/m2  SpO2 98%  Physical Exam  Nursing note and vitals reviewed. Constitutional: He is oriented to person,  place, and time. He appears well-developed and well-nourished.  HENT:  Head: Normocephalic and atraumatic.  Eyes: EOM are normal. Pupils are equal, round, and reactive to light.  Neck: Normal range of motion. Neck supple.  Cardiovascular: Normal rate, normal heart sounds and intact distal pulses.   Pulmonary/Chest: Effort normal and breath sounds normal.  Abdominal: Bowel sounds are normal. He exhibits no distension. There is tenderness (RLQ). There is guarding. There is no rebound.  Musculoskeletal: Normal range of motion. He exhibits no edema and no tenderness.  Neurological: He is alert and oriented to person, place, and time. He has normal strength. No cranial nerve deficit or sensory deficit.  Skin: Skin is warm and dry. No rash noted.  Psychiatric: He has a normal mood and affect.    ED Course  Procedures (including critical care time)  Labs Reviewed  CBC WITH DIFFERENTIAL - Abnormal; Notable for the following:    Platelets 405 (*)     All other components within normal limits  BASIC METABOLIC PANEL  URINALYSIS, ROUTINE W REFLEX MICROSCOPIC   Ct Abdomen Pelvis W Contrast  09/22/2012  *RADIOLOGY REPORT*  Clinical Data: Pain post appendectomy.  CT  ABDOMEN AND PELVIS WITH CONTRAST  Technique:  Multidetector CT imaging of the abdomen and pelvis was performed following the standard protocol during bolus administration of intravenous contrast.  Contrast: OMNIPAQUE IOHEXOL 300 MG/ML  SOLN  Comparison: 05/22/2012  Findings: Visualized lung bases are clear.  Unremarkable liver, nondilated gallbladder, spleen, adrenal glands, kidneys, pancreas, aorta.  Stomach is physiologically distended. Small bowel unremarkable.  Surgical staples near the base of the cecum.  No evidence of abscess.  The colon is nondilated, unremarkable. Urinary bladder incompletely distended.  No ascites.  No free air. No adenopathy.  Portal vein patent.  Regional bones unremarkable.  IMPRESSION:  1.  Negative for  abscess or other acute abnormality post appendectomy.   Original Report Authenticated By: D. Andria Rhein, MD      No diagnosis found.    MDM  Will recheck CT to eval for post-op abscess formation. Labs and IVF.    8:14 PM Labs and imaging unremarkable. Review of the [place] Controlled Substance database shows multiple Rx totaling over 110 tabs in the last month from various ED providers in the region as well as his surgeon from 4 days ago. Advised to followup with his surgeon or PCP for any additional pain medications requirements. No additional Rx from the ED.     Emric Kowalewski B. Bernette Mayers, MD 09/22/12 2020

## 2012-09-22 NOTE — ED Notes (Addendum)
Appendectomy 1 wk ago. DC from [facility] days ago.  Pain controlled well in [place].  Norco tabs at home not helping pain. Pt also reports occ vomiting.  Surgeon said to come to ED if it did not get better.

## 2012-10-16 ENCOUNTER — Encounter: Payer: Self-pay | Admitting: *Deleted

## 2012-10-16 ENCOUNTER — Emergency Department (INDEPENDENT_AMBULATORY_CARE_PROVIDER_SITE_OTHER)
Admission: EM | Admit: 2012-10-16 | Discharge: 2012-10-16 | Disposition: A | Discharge status: Home / Self Care | Payer: Managed Care, Other (non HMO) | Source: Home / Self Care | Attending: Family Medicine | Admitting: Family Medicine

## 2012-10-16 DIAGNOSIS — H6190 Disorder of external ear, unspecified, unspecified ear: Secondary | ICD-10-CM

## 2012-10-16 DIAGNOSIS — J209 Acute bronchitis, unspecified: Secondary | ICD-10-CM

## 2012-10-16 MED ORDER — DOXYCYCLINE HYCLATE 100 MG PO CAPS: 100.0000 mg | ORAL_CAPSULE | Freq: Two times a day (BID) | ORAL | Status: DC

## 2012-10-16 MED ORDER — BENZONATATE 200 MG PO CAPS: 200.0000 mg | ORAL_CAPSULE | Freq: Every day | ORAL | Status: DC

## 2012-10-16 NOTE — ED Provider Notes (Signed)
History     CSN: [id number]  Arrival date & time 10/16/12  1121   First MD Initiated Contact with Patient 10/16/12 1156      Chief Complaint  Patient presents with  . Knots on left ear       HPI Comments: Patient presents with two complaints: 1)  He states that he had his ears pierced about 2 years ago but has not used ear-rings for quite a while.  About two days ago he developed a tender nodule with a small amount of bloody purulent drainage from upper piercing on left ear.  He has also noticed a smaller, nontender nodule at the piercing of his left ear lobe.  2)  He complains of a non-productive cough for 5 days, without sore throat or nasal congestion.  No fevers, chills, and sweats.  He feels well otherwise.  His Tdap is current.  The history is provided by the patient.    Past Medical History  Diagnosis Date  . GERD (gastroesophageal reflux disease)   . IBS (irritable bowel syndrome)   . Hiatal hernia   . Spastic colon   . Appendicitis     Past Surgical History  Procedure Date  . Knee surgery   . Nose surgery   . Vasectomy   . Appendectomy     History reviewed. No pertinent family history.  History  Substance Use Topics  . Smoking status: Never Smoker   . Smokeless tobacco: Current User    Types: Chew  . Alcohol Use: No      Review of Systems  Constitutional: Negative.   HENT: Negative for congestion, sore throat and rhinorrhea.   Eyes: Negative.   Respiratory: Positive for cough. Negative for shortness of breath and wheezing.   Cardiovascular: Negative.   Gastrointestinal: Negative.   Genitourinary: Negative.   Musculoskeletal: Negative.   Neurological: Negative.     Allergies  Morphine and related  Home Medications   Current Outpatient Rx  Name  Route  Sig  Dispense  Refill  . BENZONATATE 200 MG PO CAPS   Oral   Take 1 capsule (200 mg total) by mouth at bedtime. Take as needed for cough   12 capsule   0   . DOXYCYCLINE HYCLATE 100 MG  PO CAPS   Oral   Take 1 capsule (100 mg total) by mouth 2 (two) times daily.   20 capsule   0   . HYDROCODONE-ACETAMINOPHEN 5-325 MG PO TABS   Oral   Take 2 tablets by mouth every 6 (six) hours as needed.         . OXYCODONE-ACETAMINOPHEN 5-325 MG PO TABS   Oral   Take 2 tablets by mouth every 4 (four) hours as needed for pain.   15 tablet   0   . PROMETHAZINE HCL 25 MG PO TABS   Oral   Take 1 tablet (25 mg total) by mouth every 6 (six) hours as needed for nausea.   20 tablet   0     BP 124/69  Pulse 64  Temp 98.1 F (36.7 C) (Oral)  Resp 16  Ht 6\' 3"  (1.905 m)  Wt 237 lb 12 oz (107.843 kg)  BMI 29.72 kg/m2  SpO2 98%  Physical Exam  Nursing note and vitals reviewed. Constitutional: He is oriented to person, place, and time. He appears well-developed and well-nourished. No distress.  HENT:  Head: Normocephalic and atraumatic.  Right Ear: External ear normal.  Ears:  Nose: Nose normal.  At the posterior aspect of mid-portion of helix is an erythematous 5mm dia. dome shaped tender nodule with central excoriation.  Lesion does not appear cystic or fluctuant.  On the central aspect of ear-lobe is a 5mm dia.soft nontender nodule suggestive of a keloid.  No erythema or fluctuance.  Eyes: Conjunctivae normal are normal. Pupils are equal, round, and reactive to light. Right eye exhibits no discharge. Left eye exhibits no discharge.  Neck: Neck supple.  Cardiovascular: Normal heart sounds.   Pulmonary/Chest: Effort normal and breath sounds normal.  Abdominal: Soft. There is no tenderness.  Lymphadenopathy:    He has no cervical adenopathy.  Neurological: He is alert and oriented to person, place, and time.  Skin: Skin is warm and dry.    ED Course  Procedures  none   Labs Reviewed  WOUND CULTURE pending      1. Lesion of external ear; appearance suspicious for basal cell CA, with cellulitis   2. Lesion of earlobe; suspect keloid   3. Acute bronchitis        MDM  Begin doxycycline.  Prescription written for Benzonatate [facility]) to take at bedtime for night-time cough.  Wound culture pending of lesion left ear. Take plain Mucinex (guaifenesin) twice daily for cough and congestion.  Increase fluid intake, rest. May use Afrin nasal spray (or generic oxymetazoline) twice daily for about 5 days.  Also recommend using saline nasal spray several times daily and saline nasal irrigation (AYR is a common brand) Stop all antihistamines for now, and other non-prescription cough/cold preparations. Apply warm compress to left ear 2 or 3 times daily.  Will refer to plastic surgeon for biopsy of upper lesion left external ear.           Lattie Haw, MD 10/18/12 [phone]

## 2012-10-16 NOTE — ED Notes (Signed)
Patient c/o pain and knots on left ear. Patient states had ears pierced years ago and knots developed years ago. Patient states he popped one of them.

## 2012-10-18 ENCOUNTER — Ambulatory Visit (INDEPENDENT_AMBULATORY_CARE_PROVIDER_SITE_OTHER): Payer: Medicaid Other | Admitting: Physician Assistant

## 2012-10-18 ENCOUNTER — Ambulatory Visit (INDEPENDENT_AMBULATORY_CARE_PROVIDER_SITE_OTHER): Payer: Managed Care, Other (non HMO)

## 2012-10-18 ENCOUNTER — Encounter: Payer: Self-pay | Admitting: Physician Assistant

## 2012-10-18 ENCOUNTER — Telehealth: Payer: Self-pay | Admitting: *Deleted

## 2012-10-18 VITALS — BP 118/72 | HR 57 | Temp 97.8°F | Resp 17 | Wt 244.0 lb

## 2012-10-18 DIAGNOSIS — R05 Cough: Secondary | ICD-10-CM

## 2012-10-18 DIAGNOSIS — R059 Cough, unspecified: Secondary | ICD-10-CM

## 2012-10-18 DIAGNOSIS — J40 Bronchitis, not specified as acute or chronic: Secondary | ICD-10-CM

## 2012-10-18 LAB — PR PEAK EXPIRATORY FLOW RATE (P

## 2012-10-18 MED ORDER — HYDROCODONE-HOMATROPINE 5-1.5 MG/5ML PO SYRP: 5.0000 mL | ORAL_SOLUTION | Freq: Three times a day (TID) | ORAL | Status: DC | PRN

## 2012-10-18 NOTE — Progress Notes (Signed)
  Subjective:    Patient ID: Theodore Walker, male    DOB: [birth year], 26 y.o.   MRN: [id number]  HPI Patient is a new patient that presents to the clinic to establish care and discuss ongoing cough. PMH upon reviewed is negative for any ongoing diseases. Not aware of family hx. No surgical hx.  He comes in today for persitient cough every 10-15 minutes. He went to [facility] a few days ago and given Tessalon pearles and does not feel like they are helping at all. He went to work today but was sent home because he cough so much he started to vomit. The cough is mostly dry but has been productive at some points. He has had sweats and chills but denies any fever. No SOB or wheezing noted. Mild sore throat but no ear pain. He has had a Tdap because he recently had a child and they required him to get booster.   Review of Systems     Objective:   Physical Exam  Constitutional: He is oriented to person, place, and time. He appears well-developed and well-nourished.  HENT:  Head: Normocephalic and atraumatic.  Right Ear: External ear normal.  Left Ear: External ear normal.  Nose: Nose normal.  Mouth/Throat: Oropharynx is clear and moist. No oropharyngeal exudate.  Eyes: Conjunctivae normal are normal.  Neck: Normal range of motion. Neck supple.  Cardiovascular: Normal rate, regular rhythm and normal heart sounds.   Pulmonary/Chest: Effort normal and breath sounds normal. He has no wheezes.       Coarse breath sounds.  Lymphadenopathy:    He has no cervical adenopathy.  Neurological: He is alert and oriented to person, place, and time.  Skin: Skin is warm and dry.  Psychiatric: He has a normal mood and affect. His behavior is normal.          Assessment & Plan:  Cough/Bronchitis- Checked Peak flow to make sure some part of the cough wasn't inflammation 630 green zone.Very unlikey he could have pertussis with being vaccinated and sound of cough did not match whooping cough. Not running a  fever but will check chest xray to rule out Pneumonia and TB. Discused a lot of symptomatic treatment with motrin, umcka cold care, honey. Did give cough syrup for night time. 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 I still feel like it is viral but did give print out of doxy if continued to get worse to start and finish. Follow up as needed.   Need fasting labs come back at beginning of year for CPE.

## 2012-10-18 NOTE — Patient Instructions (Addendum)
Umcka Cold care kit 2 drops on tongue three times a day.  Motrin Honey for cough.   Cough, Adult  A cough is a reflex that helps clear your throat and airways. It can help heal the body or may be a reaction to an irritated airway. A cough may only last 2 or 3 weeks (acute) or may last more than 8 weeks (chronic).  CAUSES Acute cough:  Viral or bacterial infections. Chronic cough:  Infections.  Allergies.  Asthma.  Post-nasal drip.  Smoking.  Heartburn or acid reflux.  Some medicines.  Chronic lung problems (COPD).  Cancer. SYMPTOMS   Cough.  Fever.  Chest pain.  Increased breathing rate.  High-pitched whistling sound when breathing (wheezing).  Colored mucus that you cough up (sputum). TREATMENT   A bacterial cough may be treated with antibiotic medicine.  A viral cough must run its course and will not respond to antibiotics.  Your caregiver may recommend other treatments if you have a chronic cough. HOME CARE INSTRUCTIONS   Only take over-the-counter or prescription medicines for pain, discomfort, or fever as directed by your caregiver. Use cough suppressants only as directed by your caregiver.  Use a cold steam vaporizer or humidifier in your bedroom or home to help loosen secretions.  Sleep in a semi-upright position if your cough is worse at night.  Rest as needed.  Stop smoking if you smoke. SEEK IMMEDIATE MEDICAL CARE IF:   You have pus in your sputum.  Your cough starts to worsen.  You cannot control your cough with suppressants and are losing sleep.  You begin coughing up blood.  You have difficulty breathing.  You develop pain which is getting worse or is uncontrolled with medicine.  You have a fever. MAKE SURE YOU:   Understand these instructions.  Will watch your condition.  Will get help right away if you are not doing well or get worse. Document Released: 04/18/2011 Document Revised: 01/12/2012 Document Reviewed:  04/18/2011 [facility] Patient Information 2013 [place], [place].

## 2012-10-19 LAB — WOUND CULTURE
Gram Stain: NONE SEEN
Gram Stain: NONE SEEN

## 2012-10-20 ENCOUNTER — Ambulatory Visit (INDEPENDENT_AMBULATORY_CARE_PROVIDER_SITE_OTHER): Payer: Managed Care, Other (non HMO) | Admitting: Physician Assistant

## 2012-10-20 ENCOUNTER — Encounter: Payer: Self-pay | Admitting: Physician Assistant

## 2012-10-20 VITALS — BP 136/78 | HR 81 | Temp 97.9°F | Resp 16 | Ht 73.5 in | Wt 237.0 lb

## 2012-10-20 DIAGNOSIS — Z13220 Encounter for screening for lipoid disorders: Secondary | ICD-10-CM

## 2012-10-20 DIAGNOSIS — M25562 Pain in left knee: Secondary | ICD-10-CM

## 2012-10-20 DIAGNOSIS — M25569 Pain in unspecified knee: Secondary | ICD-10-CM

## 2012-10-20 DIAGNOSIS — R110 Nausea: Secondary | ICD-10-CM

## 2012-10-20 MED ORDER — HYDROCODONE-ACETAMINOPHEN 5-325 MG PO TABS: 1.0000 | ORAL_TABLET | Freq: Three times a day (TID) | ORAL | Status: DC | PRN

## 2012-10-20 MED ORDER — ONDANSETRON HCL 4 MG PO TABS: 4.0000 mg | ORAL_TABLET | Freq: Three times a day (TID) | ORAL | Status: DC | PRN

## 2012-10-20 MED ORDER — OMEPRAZOLE 40 MG PO CPDR: 40.0000 mg | DELAYED_RELEASE_CAPSULE | Freq: Every day | ORAL | Status: DC

## 2012-10-20 MED ORDER — DICYCLOMINE HCL 10 MG PO CAPS: 10.0000 mg | ORAL_CAPSULE | Freq: Three times a day (TID) | ORAL | Status: DC
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
## 2012-10-20 NOTE — Patient Instructions (Addendum)
Increase prilosec 40mg . Gave Zofran and Bentyl to try. Call if not improving.   Gave cortisone shot in office.

## 2012-10-21 LAB — COMPREHENSIVE METABOLIC PANEL
ALT: 16 U/L (ref 0–53)
AST: 22 U/L (ref 0–37)
Alkaline Phosphatase: 51 U/L (ref 39–117)
Creat: 0.86 mg/dL (ref 0.50–1.35)
Total Bilirubin: 0.5 mg/dL (ref 0.3–1.2)

## 2012-10-21 LAB — LIPASE: Lipase: <10 U/L (ref 0–75)

## 2012-11-06 ENCOUNTER — Encounter (HOSPITAL_COMMUNITY): Payer: Self-pay | Admitting: Emergency Medicine

## 2012-11-06 ENCOUNTER — Emergency Department (HOSPITAL_COMMUNITY)
Admission: EM | Admit: 2012-11-06 | Discharge: 2012-11-06 | Disposition: A | Discharge status: Home / Self Care | Payer: Medicaid Other | Attending: Emergency Medicine | Admitting: Emergency Medicine

## 2012-11-06 ENCOUNTER — Emergency Department (HOSPITAL_COMMUNITY): Payer: Medicaid Other

## 2012-11-06 DIAGNOSIS — Z9079 Acquired absence of other genital organ(s): Secondary | ICD-10-CM | POA: Insufficient documentation

## 2012-11-06 DIAGNOSIS — Z8719 Personal history of other diseases of the digestive system: Secondary | ICD-10-CM | POA: Insufficient documentation

## 2012-11-06 DIAGNOSIS — M25569 Pain in unspecified knee: Secondary | ICD-10-CM

## 2012-11-06 DIAGNOSIS — S8990XA Unspecified injury of unspecified lower leg, initial encounter: Secondary | ICD-10-CM | POA: Insufficient documentation

## 2012-11-06 DIAGNOSIS — IMO0002 Reserved for concepts with insufficient information to code with codable children: Secondary | ICD-10-CM | POA: Insufficient documentation

## 2012-11-06 DIAGNOSIS — Z79899 Other long term (current) drug therapy: Secondary | ICD-10-CM | POA: Insufficient documentation

## 2012-11-06 DIAGNOSIS — K219 Gastro-esophageal reflux disease without esophagitis: Secondary | ICD-10-CM | POA: Insufficient documentation

## 2012-11-06 DIAGNOSIS — Z87891 Personal history of nicotine dependence: Secondary | ICD-10-CM | POA: Insufficient documentation

## 2012-11-06 DIAGNOSIS — R209 Unspecified disturbances of skin sensation: Secondary | ICD-10-CM | POA: Insufficient documentation

## 2012-11-06 DIAGNOSIS — S99929A Unspecified injury of unspecified foot, initial encounter: Secondary | ICD-10-CM | POA: Insufficient documentation

## 2012-11-06 DIAGNOSIS — Y9389 Activity, other specified: Secondary | ICD-10-CM | POA: Insufficient documentation

## 2012-11-06 DIAGNOSIS — Y92410 Unspecified street and highway as the place of occurrence of the external cause: Secondary | ICD-10-CM | POA: Insufficient documentation

## 2012-11-06 MED ORDER — OXYCODONE-ACETAMINOPHEN 5-325 MG PO TABS: ORAL_TABLET | ORAL | Status: DC

## 2012-11-06 MED ORDER — OXYCODONE-ACETAMINOPHEN 5-325 MG PO TABS
1.0000 | ORAL_TABLET | Freq: Once | ORAL | Status: AC
  Administered 2012-11-06: 1 via ORAL
  Filled 2012-11-06: qty 1
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
## 2012-11-06 NOTE — Discharge Instructions (Signed)
Narcotic and benzodiazepine use may cause drowsiness, slowed breathing or dependence.  Please use with caution and do not drive, operate machinery or watch young children alone while taking them.  Taking combinations of these medications or drinking alcohol will potentiate these effects.    

## 2012-11-06 NOTE — ED Notes (Signed)
Patient transported to X-ray 

## 2012-12-18 ENCOUNTER — Encounter (HOSPITAL_COMMUNITY): Payer: Self-pay | Admitting: Emergency Medicine

## 2012-12-18 ENCOUNTER — Emergency Department (HOSPITAL_COMMUNITY): Payer: Medicaid Other

## 2012-12-18 ENCOUNTER — Emergency Department (HOSPITAL_COMMUNITY)
Admission: EM | Admit: 2012-12-18 | Discharge: 2012-12-18 | Disposition: A | Discharge status: Home / Self Care | Payer: Medicaid Other | Attending: Emergency Medicine | Admitting: Emergency Medicine

## 2012-12-18 DIAGNOSIS — M549 Dorsalgia, unspecified: Secondary | ICD-10-CM | POA: Insufficient documentation

## 2012-12-18 DIAGNOSIS — S300XXA Contusion of lower back and pelvis, initial encounter: Secondary | ICD-10-CM | POA: Insufficient documentation

## 2012-12-18 DIAGNOSIS — Z8719 Personal history of other diseases of the digestive system: Secondary | ICD-10-CM | POA: Insufficient documentation

## 2012-12-18 DIAGNOSIS — Y9289 Other specified places as the place of occurrence of the external cause: Secondary | ICD-10-CM | POA: Insufficient documentation

## 2012-12-18 DIAGNOSIS — K219 Gastro-esophageal reflux disease without esophagitis: Secondary | ICD-10-CM | POA: Insufficient documentation

## 2012-12-18 DIAGNOSIS — Z87891 Personal history of nicotine dependence: Secondary | ICD-10-CM | POA: Insufficient documentation

## 2012-12-18 DIAGNOSIS — Y9323 Activity, snow (alpine) (downhill) skiing, snow boarding, sledding, tobogganing and snow tubing: Secondary | ICD-10-CM | POA: Insufficient documentation

## 2012-12-18 DIAGNOSIS — Z79899 Other long term (current) drug therapy: Secondary | ICD-10-CM | POA: Insufficient documentation

## 2012-12-18 DIAGNOSIS — Z9089 Acquired absence of other organs: Secondary | ICD-10-CM | POA: Insufficient documentation

## 2012-12-18 DIAGNOSIS — W19XXXA Unspecified fall, initial encounter: Secondary | ICD-10-CM

## 2012-12-18 DIAGNOSIS — W010XXA Fall on same level from slipping, tripping and stumbling without subsequent striking against object, initial encounter: Secondary | ICD-10-CM | POA: Insufficient documentation

## 2012-12-18 MED ORDER — HYDROCODONE-ACETAMINOPHEN 5-325 MG PO TABS: 1.0000 | ORAL_TABLET | Freq: Four times a day (QID) | ORAL | Status: DC | PRN

## 2012-12-18 MED ORDER — ONDANSETRON 8 MG PO TBDP: 8.0000 mg | ORAL_TABLET | Freq: Three times a day (TID) | ORAL | Status: DC | PRN

## 2012-12-18 MED ORDER — NAPROXEN 500 MG PO TABS: 500.0000 mg | ORAL_TABLET | Freq: Two times a day (BID) | ORAL | Status: DC

## 2012-12-18 MED ORDER — OXYCODONE-ACETAMINOPHEN 5-325 MG PO TABS
1.0000 | ORAL_TABLET | Freq: Once | ORAL | Status: AC
  Administered 2012-12-18: 1 via ORAL
  Filled 2012-12-18: qty 1

## 2012-12-18 MED ORDER — ONDANSETRON 8 MG PO TBDP
8.0000 mg | ORAL_TABLET | Freq: Once | ORAL | Status: AC
  Administered 2012-12-18: 8 mg via ORAL
  Filled 2012-12-18: qty 1

## 2012-12-18 NOTE — ED Notes (Signed)
Pt fell off sled last night. C/o coccyx pain unresponsive to motrin

## 2012-12-18 NOTE — ED Provider Notes (Signed)
Medical screening examination/treatment/procedure(s) were performed by non-physician practitioner and as supervising physician I was immediately available for consultation/collaboration.  Tobin Chad, MD 12/18/12 [phone]

## 2012-12-18 NOTE — ED Provider Notes (Signed)
History     CSN: [id number]  Arrival date & time 12/18/12  7829   First MD Initiated Contact with Patient 12/18/12 1005      No chief complaint on file.   (Consider location/radiation/quality/duration/timing/severity/associated sxs/prior treatment) HPI Theodore Walker is a 27 y.o. male who presents with complaint of fall and back pain. States was sledding and fell onto his buttock. States pain in the sacrum and coccyx. Unable to sit or walk without pain. Tried ibuprofen with no relief. No radiation to the legs. No problems with urination or having bowel movements. No fever.  No other injuries.    Past Medical History  Diagnosis Date  . GERD (gastroesophageal reflux disease)   . IBS (irritable bowel syndrome)   . Hiatal hernia   . Spastic colon   . Appendicitis   . GERD (gastroesophageal reflux disease)     Past Surgical History  Procedure Laterality Date  . Knee surgery    . Nose surgery    . Vasectomy    . Appendectomy      No family history on file.  History  Substance Use Topics  . Smoking status: Former Smoker -- 0.50 packs/day for 2 years    Types: Cigarettes  . Smokeless tobacco: Current User    Types: Chew  . Alcohol Use: No      Review of Systems  Genitourinary: Negative for dysuria and flank pain.  Musculoskeletal: Positive for back pain.  Neurological: Negative for weakness and numbness.    Allergies  Morphine and related  Home Medications   Current Outpatient Rx  Name  Route  Sig  Dispense  Refill  . HYDROcodone-acetaminophen (NORCO/VICODIN) 5-325 MG per tablet   Oral   Take 1 tablet by mouth every 8 (eight) hours as needed. Pain         . omeprazole (PRILOSEC) 40 MG capsule   Oral   Take 40 mg by mouth daily.         . ondansetron (ZOFRAN) 4 MG tablet   Oral   Take 4 mg by mouth every 8 (eight) hours as needed.         Marland Kitchen oxyCODONE-acetaminophen (PERCOCET/ROXICET) 5-325 MG per tablet      1-2 tablets po q 6 hours prn  moderate to severe pain   15 tablet   0     There were no vitals taken for this visit.  Physical Exam  Nursing note and vitals reviewed. Constitutional: He appears well-developed and well-nourished. No distress.  Cardiovascular: Normal rate, regular rhythm and normal heart sounds.   Pulmonary/Chest: Effort normal and breath sounds normal. No respiratory distress. He has no wheezes. He has no rales.  Musculoskeletal:  Normal appearing sacrum and coccyx. No bruising, swelling, deformity.  Tender to palpation over scarum and coccyx midline. No pain with bilateral straight leg raise. 5/5 and equal bilateral LE strength. Normal sensation of bilateral LE.   Neurological: He is alert.  Skin: Skin is warm and dry.    ED Course  Procedures (including critical care time)  Labs Reviewed - No data to display No results found.  Dg Sacrum/coccyx  12/18/2012  *RADIOLOGY REPORT*  Clinical Data: Fall with sacral/coccygeal pain.  SACRUM AND COCCYX - 2+ VIEW  Comparison: None  Findings: There is no evidence of fracture. No bony abnormalities are identified. No focal bony lesions are present.  IMPRESSION: No evidence of bony abnormality.   Original Report Authenticated By: Harmon Pier, M.D.      1.  Contusion of coccyx   2. Fall       MDM  Pt with pain in the sacrum and coccyx after falling down onto his buttom while sliding down a slide. Pain localized just to that area. No radiation of pain. No weakness or numbness of extremities. No red flags suggesting cauda equina. Pt's pain treated in ED with percocet. X-ray negative. Home with naprosyn and vicodin. Follow up with PCP.         Lottie Mussel, PA 12/18/12 1131

## 2012-12-18 NOTE — ED Notes (Signed)
Patient transported to X-ray 

## 2013-01-19 ENCOUNTER — Encounter (HOSPITAL_BASED_OUTPATIENT_CLINIC_OR_DEPARTMENT_OTHER): Payer: Self-pay

## 2013-01-19 ENCOUNTER — Emergency Department (HOSPITAL_BASED_OUTPATIENT_CLINIC_OR_DEPARTMENT_OTHER)
Admission: EM | Admit: 2013-01-19 | Discharge: 2013-01-19 | Disposition: A | Discharge status: Home / Self Care | Payer: Medicaid Other | Attending: Emergency Medicine | Admitting: Emergency Medicine

## 2013-01-19 ENCOUNTER — Emergency Department (HOSPITAL_BASED_OUTPATIENT_CLINIC_OR_DEPARTMENT_OTHER): Payer: Medicaid Other

## 2013-01-19 DIAGNOSIS — Y9389 Activity, other specified: Secondary | ICD-10-CM | POA: Insufficient documentation

## 2013-01-19 DIAGNOSIS — K219 Gastro-esophageal reflux disease without esophagitis: Secondary | ICD-10-CM | POA: Insufficient documentation

## 2013-01-19 DIAGNOSIS — Z8719 Personal history of other diseases of the digestive system: Secondary | ICD-10-CM | POA: Insufficient documentation

## 2013-01-19 DIAGNOSIS — IMO0002 Reserved for concepts with insufficient information to code with codable children: Secondary | ICD-10-CM | POA: Insufficient documentation

## 2013-01-19 DIAGNOSIS — Y92410 Unspecified street and highway as the place of occurrence of the external cause: Secondary | ICD-10-CM | POA: Insufficient documentation

## 2013-01-19 DIAGNOSIS — Z79899 Other long term (current) drug therapy: Secondary | ICD-10-CM | POA: Insufficient documentation

## 2013-01-19 MED ORDER — HYDROCODONE-ACETAMINOPHEN 5-325 MG PO TABS: 1.0000 | ORAL_TABLET | Freq: Four times a day (QID) | ORAL | Status: DC | PRN

## 2013-01-19 MED ORDER — HYDROCODONE-ACETAMINOPHEN 5-325 MG PO TABS
2.0000 | ORAL_TABLET | Freq: Once | ORAL | Status: AC
  Administered 2013-01-19: 2 via ORAL
  Filled 2013-01-19: qty 2
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
## 2013-01-28 ENCOUNTER — Emergency Department (HOSPITAL_COMMUNITY)
Admission: EM | Admit: 2013-01-28 | Discharge: 2013-01-28 | Disposition: A | Discharge status: Home / Self Care | Payer: Medicaid Other | Attending: Emergency Medicine | Admitting: Emergency Medicine

## 2013-01-28 ENCOUNTER — Emergency Department (HOSPITAL_COMMUNITY): Payer: Medicaid Other

## 2013-01-28 ENCOUNTER — Encounter (HOSPITAL_COMMUNITY): Payer: Self-pay | Admitting: *Deleted

## 2013-01-28 DIAGNOSIS — Y9389 Activity, other specified: Secondary | ICD-10-CM | POA: Insufficient documentation

## 2013-01-28 DIAGNOSIS — Z79899 Other long term (current) drug therapy: Secondary | ICD-10-CM | POA: Insufficient documentation

## 2013-01-28 DIAGNOSIS — S8002XA Contusion of left knee, initial encounter: Secondary | ICD-10-CM

## 2013-01-28 DIAGNOSIS — W08XXXA Fall from other furniture, initial encounter: Secondary | ICD-10-CM | POA: Insufficient documentation

## 2013-01-28 DIAGNOSIS — K219 Gastro-esophageal reflux disease without esophagitis: Secondary | ICD-10-CM | POA: Insufficient documentation

## 2013-01-28 DIAGNOSIS — S8000XA Contusion of unspecified knee, initial encounter: Secondary | ICD-10-CM | POA: Insufficient documentation

## 2013-01-28 DIAGNOSIS — Y929 Unspecified place or not applicable: Secondary | ICD-10-CM | POA: Insufficient documentation

## 2013-01-28 DIAGNOSIS — Z8719 Personal history of other diseases of the digestive system: Secondary | ICD-10-CM | POA: Insufficient documentation

## 2013-01-28 DIAGNOSIS — R109 Unspecified abdominal pain: Secondary | ICD-10-CM | POA: Insufficient documentation

## 2013-01-28 MED ORDER — DICLOFENAC SODIUM 75 MG PO TBEC: 75.0000 mg | DELAYED_RELEASE_TABLET | Freq: Two times a day (BID) | ORAL | Status: DC

## 2013-01-28 MED ORDER — HYDROCODONE-ACETAMINOPHEN 7.5-325 MG PO TABS: 1.0000 | ORAL_TABLET | ORAL | Status: DC | PRN

## 2013-01-28 MED ORDER — KETOROLAC TROMETHAMINE 10 MG PO TABS
10.0000 mg | ORAL_TABLET | Freq: Once | ORAL | Status: AC
  Administered 2013-01-28: 10 mg via ORAL
  Filled 2013-01-28: qty 1
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
## 2013-01-28 NOTE — ED Notes (Signed)
Crutches not given to pt, pt says has a set at home.

## 2013-01-28 NOTE — ED Notes (Signed)
Pt ambulated to restroom. 

## 2013-01-28 NOTE — ED Notes (Signed)
Lt knee pain, fell when stool gave way,

## 2013-01-29 NOTE — ED Provider Notes (Signed)
Medical screening examination/treatment/procedure(s) were performed by non-physician practitioner and as supervising physician I was immediately available for consultation/collaboration.   Natha Guin III, MD 01/29/13 1130 

## 2013-04-24 ENCOUNTER — Emergency Department (HOSPITAL_BASED_OUTPATIENT_CLINIC_OR_DEPARTMENT_OTHER): Payer: BC Managed Care – PPO

## 2013-04-24 ENCOUNTER — Emergency Department (HOSPITAL_BASED_OUTPATIENT_CLINIC_OR_DEPARTMENT_OTHER)
Admission: EM | Admit: 2013-04-24 | Discharge: 2013-04-24 | Disposition: A | Discharge status: Home / Self Care | Payer: BC Managed Care – PPO | Attending: Emergency Medicine | Admitting: Emergency Medicine

## 2013-04-24 ENCOUNTER — Encounter (HOSPITAL_BASED_OUTPATIENT_CLINIC_OR_DEPARTMENT_OTHER): Payer: Self-pay | Admitting: *Deleted

## 2013-04-24 DIAGNOSIS — Z79899 Other long term (current) drug therapy: Secondary | ICD-10-CM | POA: Insufficient documentation

## 2013-04-24 DIAGNOSIS — R112 Nausea with vomiting, unspecified: Secondary | ICD-10-CM | POA: Insufficient documentation

## 2013-04-24 DIAGNOSIS — IMO0001 Reserved for inherently not codable concepts without codable children: Secondary | ICD-10-CM | POA: Insufficient documentation

## 2013-04-24 DIAGNOSIS — G8929 Other chronic pain: Secondary | ICD-10-CM | POA: Insufficient documentation

## 2013-04-24 DIAGNOSIS — K219 Gastro-esophageal reflux disease without esophagitis: Secondary | ICD-10-CM | POA: Insufficient documentation

## 2013-04-24 DIAGNOSIS — Z8719 Personal history of other diseases of the digestive system: Secondary | ICD-10-CM | POA: Insufficient documentation

## 2013-04-24 DIAGNOSIS — G43909 Migraine, unspecified, not intractable, without status migrainosus: Secondary | ICD-10-CM

## 2013-04-24 DIAGNOSIS — M25519 Pain in unspecified shoulder: Secondary | ICD-10-CM | POA: Insufficient documentation

## 2013-04-24 LAB — CBC WITH DIFFERENTIAL/PLATELET
HCT: 39.5 % (ref 39.0–52.0)
Hemoglobin: 13.8 g/dL (ref 13.0–17.0)
Lymphocytes Relative: 13 % (ref 12–46)
Monocytes Absolute: 0.6 10*3/uL (ref 0.1–1.0)
Monocytes Relative: 6 % (ref 3–12)
Neutro Abs: 7.7 10*3/uL (ref 1.7–7.7)
RBC: 4.39 MIL/uL (ref 4.22–5.81)
WBC: 10 10*3/uL (ref 4.0–10.5)

## 2013-04-24 LAB — BASIC METABOLIC PANEL
BUN: 13 mg/dL (ref 6–23)
CO2: 26 mEq/L (ref 19–32)
Chloride: 101 mEq/L (ref 96–112)
Creatinine, Ser: 0.9 mg/dL (ref 0.50–1.35)

## 2013-04-24 MED ORDER — METOCLOPRAMIDE HCL 5 MG/ML IJ SOLN
10.0000 mg | Freq: Once | INTRAMUSCULAR | Status: AC
  Administered 2013-04-24: 10 mg via INTRAVENOUS
  Filled 2013-04-24: qty 2

## 2013-04-24 MED ORDER — ONDANSETRON 4 MG PO TBDP
4.0000 mg | ORAL_TABLET | Freq: Once | ORAL | Status: AC
  Administered 2013-04-24: 4 mg via ORAL
  Filled 2013-04-24: qty 1

## 2013-04-24 MED ORDER — DEXAMETHASONE SODIUM PHOSPHATE 10 MG/ML IJ SOLN
10.0000 mg | Freq: Once | INTRAMUSCULAR | Status: AC
  Administered 2013-04-24: 10 mg via INTRAVENOUS
  Filled 2013-04-24: qty 1

## 2013-04-24 MED ORDER — DIPHENHYDRAMINE HCL 50 MG/ML IJ SOLN
25.0000 mg | Freq: Once | INTRAMUSCULAR | Status: AC
  Administered 2013-04-24: 25 mg via INTRAVENOUS
  Filled 2013-04-24: qty 1

## 2013-04-24 MED ORDER — SODIUM CHLORIDE 0.9 % IV SOLN
INTRAVENOUS | Status: DC
  Administered 2013-04-24: 17:00:00 via INTRAVENOUS

## 2013-04-24 NOTE — ED Notes (Addendum)
Pt states he woke up this a.m. with headache. No hx of same. +nausea. PERRL

## 2013-04-24 NOTE — ED Provider Notes (Signed)
History    Scribed for Carleene Cooper III, MD, the patient was seen in room MH02/MH02. This chart was scribed by Lewanda Rife, ED scribe. Patient's care was started at 1555  CSN: [id number]  Arrival date & time 04/24/13  1409   First MD Initiated Contact with Patient 04/24/13 1545      Chief Complaint  Patient presents with  . Headache    (Consider location/radiation/quality/duration/timing/severity/associated sxs/prior treatment) The history is provided by the patient.   HPI Comments: Theodore Walker is a 27 y.o. male who presents to the Emergency Department complaining of constant moderate, headache radiating to bilateral eyes onset this morning. Describes headache as throbbing. Reports associated nausea, emesis, and photophobia. Denies associates abdominal pain, fever, recent injury, and neck pain. Denies hx of similar headache in the past. Denies alleviating or aggravating factors. Reports mother has a hx of migraines. 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 Reports hx of colonoscopy, and appendectomy. Reports he chews tobacco.   Past Medical History  Diagnosis Date  . GERD (gastroesophageal reflux disease)   . IBS (irritable bowel syndrome)   . Hiatal hernia   . Spastic colon   . Appendicitis   . GERD (gastroesophageal reflux disease)     Past Surgical History  Procedure Laterality Date  . Knee surgery    . Nose surgery    . Vasectomy    . Appendectomy      Family History  Problem Relation Age of Onset  . Hypertension Mother   . Diabetes Mother     History  Substance Use Topics  . Smoking status: Never Smoker   . Smokeless tobacco: Current User    Types: Chew  . Alcohol Use: No      Review of Systems  Constitutional: Negative for fever.  HENT: Negative for neck pain.   Eyes: Positive for photophobia.  Gastrointestinal: Positive for nausea and vomiting. Negative for abdominal pain.   Musculoskeletal: Positive for myalgias (chronic shoulder pain ).  Neurological: Positive for headaches.  Psychiatric/Behavioral: Negative for confusion.    Allergies  Morphine and related  Home Medications   Current Outpatient Rx  Name  Route  Sig  Dispense  Refill  . cyclobenzaprine (FLEXERIL) 10 MG tablet   Oral   Take 10 mg by mouth 3 (three) times daily as needed for muscle spasms.         . diclofenac (VOLTAREN) 75 MG EC tablet   Oral   Take 1 tablet (75 mg total) by mouth 2 (two) times daily.   12 tablet   0   . HYDROcodone-acetaminophen (NORCO) 7.5-325 MG per tablet   Oral   Take 1 tablet by mouth every 4 (four) hours as needed for pain.   20 tablet   0   . omeprazole (PRILOSEC) 40 MG capsule   Oral   Take 40 mg by mouth daily.           BP 130/68  Pulse 76  Temp(Src) 98.8 F (37.1 C) (Oral)  Resp 20  Ht 6\' 3"  (1.905 m)  Wt 234 lb (106.142 kg)  BMI 29.25 kg/m2  SpO2 95%  Physical Exam  Nursing note and vitals reviewed. Constitutional: He is oriented to person, place, and time. He appears well-developed and well-nourished. No distress.  HENT:  Head: Normocephalic and atraumatic.  Right Ear: Tympanic membrane normal.  Left Ear: Tympanic membrane normal.  Eyes: Conjunctivae and EOM are normal. Pupils are equal,  round, and reactive to light.  Neck: Trachea normal, normal range of motion and full passive range of motion without pain. Neck supple. No tracheal tenderness, no spinous process tenderness and no muscular tenderness present. No tracheal deviation present.  Cardiovascular: Normal rate, regular rhythm and normal heart sounds.   Pulmonary/Chest: Effort normal and breath sounds normal. No respiratory distress. He has no wheezes.  Abdominal: Soft. He exhibits no distension. There is no tenderness.  Musculoskeletal: Normal range of motion.       Cervical back: Normal. He exhibits no tenderness and no bony tenderness.  No reproducible head/scalp  tenderness on exam. No bony abnormalities on scalp noted.    Lymphadenopathy:    He has no cervical adenopathy.  Neurological: He is alert and oriented to person, place, and time.  Neurologically intact. Normal eye tracking on exam.  Skin: Skin is warm and dry.  Psychiatric: He has a normal mood and affect. His behavior is normal.    ED Course  Procedures (including critical care time) Medications  0.9 %  sodium chloride infusion (not administered)  metoCLOPramide (REGLAN) injection 10 mg (not administered)  dexamethasone (DECADRON) injection 10 mg (not administered)  diphenhydrAMINE (BENADRYL) injection 25 mg (not administered)  ondansetron (ZOFRAN-ODT) disintegrating tablet 4 mg (4 mg Oral Given 04/24/13 1541)  4:05 PM Education given on oral cancer and encouraged to quit chewing tobacco. Pt agrees with plan of migraine cocktail and CT scan of headache.    5:24 PM Results for orders placed during the hospital encounter of 04/24/13  CBC WITH DIFFERENTIAL      Result Value Range   WBC 10.0  4.0 - 10.5 K/uL   RBC 4.39  4.22 - 5.81 MIL/uL   Hemoglobin 13.8  13.0 - 17.0 g/dL   HCT 16.1  09.6 - 04.5 %   MCV 90.0  78.0 - 100.0 fL   MCH 31.4  26.0 - 34.0 pg   MCHC 34.9  30.0 - 36.0 g/dL   RDW 40.9  81.1 - 91.4 %   Platelets 327  150 - 400 K/uL   Neutrophils Relative % 78 (*) 43 - 77 %   Neutro Abs 7.7  1.7 - 7.7 K/uL   Lymphocytes Relative 13  12 - 46 %   Lymphs Abs 1.3  0.7 - 4.0 K/uL   Monocytes Relative 6  3 - 12 %   Monocytes Absolute 0.6  0.1 - 1.0 K/uL   Eosinophils Relative 3  0 - 5 %   Eosinophils Absolute 0.3  0.0 - 0.7 K/uL   Basophils Relative 1  0 - 1 %   Basophils Absolute 0.1  0.0 - 0.1 K/uL  BASIC METABOLIC PANEL      Result Value Range   Sodium 137  135 - 145 mEq/L   Potassium 4.0  3.5 - 5.1 mEq/L   Chloride 101  96 - 112 mEq/L   CO2 26  19 - 32 mEq/L   Glucose, Bld 90  70 - 99 mg/dL   BUN 13  6 - 23 mg/dL   Creatinine, Ser 7.82  0.50 - 1.35 mg/dL   Calcium  9.8  8.4 - 95.6 mg/dL   GFR calc non Af Amer >90  >90 mL/min   GFR calc Af Amer >90  >90 mL/min   Ct Head Wo Contrast  04/24/2013   *RADIOLOGY REPORT*  Clinical Data:  Woke up with severe frontal headache/migraines.  CT HEAD WITHOUT CONTRAST  Technique:  Contiguous axial images were  obtained from the base of the skull through the vertex without contrast  Comparison:  None.  Findings:  The brain has a normal appearance without evidence for hemorrhage, acute infarction, hydrocephalus, or mass lesion.  There is no extra axial fluid collection.  The skull and paranasal sinuses are normal.  IMPRESSION: Normal CT of the head without contrast.   Original Report Authenticated By: Davonna Belling, M.D.    5:25 PM Lab workup was negative.  Pt feels better, is ready to go home.  Advised that this may have been a migraine headache.  Advised to rest in a darkened room today.       1. Migraine     I personally performed the services described in this documentation, which was scribed in my presence. The recorded information has been reviewed and is accurate.  Osvaldo Human, MD      Carleene Cooper III, MD 04/24/13 [phone]

## 2013-04-24 NOTE — ED Notes (Signed)
Returned from ct scan states is still unable to provide urine sample

## 2013-06-03 ENCOUNTER — Emergency Department (HOSPITAL_BASED_OUTPATIENT_CLINIC_OR_DEPARTMENT_OTHER)
Admission: EM | Admit: 2013-06-03 | Discharge: 2013-06-03 | Disposition: A | Discharge status: Home / Self Care | Payer: Self-pay | Attending: Emergency Medicine | Admitting: Emergency Medicine

## 2013-06-03 ENCOUNTER — Emergency Department (HOSPITAL_BASED_OUTPATIENT_CLINIC_OR_DEPARTMENT_OTHER): Payer: Self-pay

## 2013-06-03 ENCOUNTER — Encounter (HOSPITAL_BASED_OUTPATIENT_CLINIC_OR_DEPARTMENT_OTHER): Payer: Self-pay | Admitting: *Deleted

## 2013-06-03 DIAGNOSIS — K219 Gastro-esophageal reflux disease without esophagitis: Secondary | ICD-10-CM | POA: Insufficient documentation

## 2013-06-03 DIAGNOSIS — Y9289 Other specified places as the place of occurrence of the external cause: Secondary | ICD-10-CM | POA: Insufficient documentation

## 2013-06-03 DIAGNOSIS — IMO0002 Reserved for concepts with insufficient information to code with codable children: Secondary | ICD-10-CM | POA: Insufficient documentation

## 2013-06-03 DIAGNOSIS — Z8781 Personal history of (healed) traumatic fracture: Secondary | ICD-10-CM | POA: Insufficient documentation

## 2013-06-03 DIAGNOSIS — Z9889 Other specified postprocedural states: Secondary | ICD-10-CM | POA: Insufficient documentation

## 2013-06-03 DIAGNOSIS — Y9389 Activity, other specified: Secondary | ICD-10-CM | POA: Insufficient documentation

## 2013-06-03 DIAGNOSIS — S86912A Strain of unspecified muscle(s) and tendon(s) at lower leg level, left leg, initial encounter: Secondary | ICD-10-CM

## 2013-06-03 DIAGNOSIS — Z79899 Other long term (current) drug therapy: Secondary | ICD-10-CM | POA: Insufficient documentation

## 2013-06-03 DIAGNOSIS — Z8719 Personal history of other diseases of the digestive system: Secondary | ICD-10-CM | POA: Insufficient documentation

## 2013-06-03 MED ORDER — HYDROCODONE-ACETAMINOPHEN 5-325 MG PO TABS: ORAL_TABLET | ORAL | Status: DC

## 2013-06-03 MED ORDER — OXYCODONE-ACETAMINOPHEN 5-325 MG PO TABS
2.0000 | ORAL_TABLET | Freq: Once | ORAL | Status: DC
  Filled 2013-06-03: qty 2

## 2013-06-03 MED ORDER — IBUPROFEN 800 MG PO TABS: 800.0000 mg | ORAL_TABLET | Freq: Three times a day (TID) | ORAL | Status: DC

## 2013-06-03 MED ORDER — KETOROLAC TROMETHAMINE 60 MG/2ML IM SOLN
60.0000 mg | Freq: Once | INTRAMUSCULAR | Status: AC
  Administered 2013-06-03: 60 mg via INTRAMUSCULAR
  Filled 2013-06-03: qty 2

## 2013-06-03 NOTE — Discharge Instructions (Signed)
Knee Sprain A knee sprain is a tear in one of the strong, fibrous tissues that connect the bones (ligaments) in your knee. The severity of the sprain depends on how much of the ligament is torn. The tear can be either partial or complete. CAUSES  Often, sprains are a result of a fall or injury. The force of the impact causes the fibers of your ligament to stretch too much. This excess tension causes the fibers of your ligament to tear. SYMPTOMS  You may have some loss of motion in your knee. Other symptoms include:  Bruising.  Tenderness.  Swelling. DIAGNOSIS  In order to diagnose knee sprain, your caregiver will physically examine your knee to determine how torn the ligament is. Your caregiver may also suggest an X-ray exam of your knee to make sure no bones are broken. TREATMENT  If your ligament is only partially torn, treatment usually involves keeping the knee in a fixed position (immobilization) or bracing your knee for activities that require movement for several weeks. To do this, your caregiver will apply a bandage, cast, or splint to keep your knee from moving or support your knee during movement until it heals. For a partially torn ligament, the healing process usually takes 4 to 6 weeks. If your ligament is completely torn, depending on which ligament it is, you may need surgery to reconnect the ligament to the bone or reconstruct it. After surgery, a cast or splint may be applied and will need to stay on your knee for 4 to 6 weeks while your ligament heals. HOME CARE INSTRUCTIONS  Keep your injured knee elevated to decrease swelling.  To ease pain and swelling, apply ice to your knee twice a day, for 2 to 3 days:  Put ice in a plastic bag.  Place a towel between your skin and the bag.  Leave the ice on for 15 minutes.  Only take over-the-counter or prescription medicine for pain as directed by your caregiver.  Do not leave your knee unprotected until pain and stiffness go  away (usually 4 to 6 weeks).  Do not allow your cast or splint to get wet. If you have been instructed not to remove it, cover your cast or splint with a plastic bag when you shower or bathe. Do not swim.  Your caregiver may suggest exercises for you to do during your recovery to prevent or limit permanent weakness and stiffness. SEEK IMMEDIATE MEDICAL CARE IF:  Your cast or splint becomes damaged.  Your pain becomes worse. MAKE SURE YOU:  Understand these instructions.  Will watch your condition.  Will get help right away if you are not doing well or get worse. Document Released: 10/20/2005 Document Revised: 01/12/2012 Document Reviewed: 10/04/2011 [employer] Patient Information 2014 [employer].  
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
## 2013-06-11 ENCOUNTER — Emergency Department (HOSPITAL_COMMUNITY): Payer: BC Managed Care – PPO

## 2013-06-11 ENCOUNTER — Emergency Department (HOSPITAL_COMMUNITY)
Admission: EM | Admit: 2013-06-11 | Discharge: 2013-06-11 | Disposition: A | Discharge status: Home / Self Care | Payer: BC Managed Care – PPO | Attending: Emergency Medicine | Admitting: Emergency Medicine

## 2013-06-11 ENCOUNTER — Encounter (HOSPITAL_COMMUNITY): Payer: Self-pay | Admitting: *Deleted

## 2013-06-11 DIAGNOSIS — Y9389 Activity, other specified: Secondary | ICD-10-CM | POA: Insufficient documentation

## 2013-06-11 DIAGNOSIS — S8990XA Unspecified injury of unspecified lower leg, initial encounter: Secondary | ICD-10-CM | POA: Insufficient documentation

## 2013-06-11 DIAGNOSIS — Z79899 Other long term (current) drug therapy: Secondary | ICD-10-CM | POA: Insufficient documentation

## 2013-06-11 DIAGNOSIS — IMO0002 Reserved for concepts with insufficient information to code with codable children: Secondary | ICD-10-CM | POA: Insufficient documentation

## 2013-06-11 DIAGNOSIS — Z9889 Other specified postprocedural states: Secondary | ICD-10-CM | POA: Insufficient documentation

## 2013-06-11 DIAGNOSIS — Z8719 Personal history of other diseases of the digestive system: Secondary | ICD-10-CM | POA: Insufficient documentation

## 2013-06-11 DIAGNOSIS — S8992XA Unspecified injury of left lower leg, initial encounter: Secondary | ICD-10-CM

## 2013-06-11 DIAGNOSIS — K219 Gastro-esophageal reflux disease without esophagitis: Secondary | ICD-10-CM | POA: Insufficient documentation

## 2013-06-11 DIAGNOSIS — Z791 Long term (current) use of non-steroidal anti-inflammatories (NSAID): Secondary | ICD-10-CM | POA: Insufficient documentation

## 2013-06-11 DIAGNOSIS — Y92410 Unspecified street and highway as the place of occurrence of the external cause: Secondary | ICD-10-CM | POA: Insufficient documentation
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
## 2013-07-07 ENCOUNTER — Ambulatory Visit (INDEPENDENT_AMBULATORY_CARE_PROVIDER_SITE_OTHER): Payer: BC Managed Care – PPO | Admitting: Sports Medicine

## 2013-07-07 ENCOUNTER — Encounter: Payer: Self-pay | Admitting: Sports Medicine

## 2013-07-07 VITALS — BP 134/81 | HR 85 | Wt 228.0 lb

## 2013-07-07 DIAGNOSIS — M5136 Other intervertebral disc degeneration, lumbar region: Secondary | ICD-10-CM | POA: Insufficient documentation

## 2013-07-07 DIAGNOSIS — M545 Low back pain, unspecified: Secondary | ICD-10-CM

## 2013-07-07 DIAGNOSIS — M51369 Other intervertebral disc degeneration, lumbar region without mention of lumbar back pain or lower extremity pain: Secondary | ICD-10-CM | POA: Insufficient documentation

## 2013-07-07 MED ORDER — CYCLOBENZAPRINE HCL 10 MG PO TABS: ORAL_TABLET | ORAL | Status: DC

## 2013-07-07 MED ORDER — PREDNISONE 50 MG PO TABS: ORAL_TABLET | ORAL | Status: DC

## 2013-07-07 NOTE — Progress Notes (Signed)
  Subjective:    CC: Low back pain  HPI: This is a very pleasant 27 year old male, he tried to lift something heavy today, felt a pop in his back, and subsequent extreme pain with radiation down the posterior aspect of both thighs, lower legs, but not past the ankles. It's worse when coughing, worse when sitting in a car. Severe, persistent. No bowel or bladder dysfunction, no saddle anesthesia.  Past medical history, Surgical history, Family history not pertinant except as noted below, Social history, Allergies, and medications have been entered into the medical record, reviewed, and no changes needed.   Review of Systems: No fevers, chills, night sweats, weight loss, chest pain, or shortness of breath.   Objective:    General: Well Developed, well nourished, and in no acute distress.  Neuro: Alert and oriented x3, extra-ocular muscles intact, sensation grossly intact.  HEENT: Normocephalic, atraumatic, pupils equal round reactive to light, neck supple, no masses, no lymphadenopathy, thyroid nonpalpable.  Skin: Warm and dry, no rashes. Cardiac: Regular rate and rhythm, no murmurs rubs or gallops, no lower extremity edema.  Respiratory: Clear to auscultation bilaterally. Not using accessory muscles, speaking in full sentences. Back Exam:  Inspection: Unremarkable  Motion: Flexion 45 deg, Extension 45 deg, Side Bending to 45 deg bilaterally,  Rotation to 45 deg bilaterally  SLR laying: Negative  XSLR laying: Negative  Palpable tenderness: None. FABER: negative. Sensory change: Gross sensation intact to all lumbar and sacral dermatomes.  Reflexes: 2+ at both patellar tendons, 2+ at achilles tendons, Babinski's downgoing.  Strength at foot  Plantar-flexion: 5/5 Dorsi-flexion: 5/5 Eversion: 5/5 Inversion: 5/5  Leg strength  Quad: 5/5 Hamstring: 5/5 Hip flexor: 5/5 Hip abductors: 5/5  Gait unremarkable.  I did review an old CT scan obtained for appendicitis, on close review of his  lumbar spine he does have mild spinal stenosis at the L4-L5 level. He has small disc protrusions at multiple other levels.  Impression and Recommendations:

## 2013-07-07 NOTE — Assessment & Plan Note (Signed)
After trying to lift something heavy this morning. Symptoms are discogenic with bilateral radiculitis down the posterior aspect of both legs. Toradol 30 mg intramuscular. Prednisone, ibuprofen, Flexeril, formal physical therapy. Return to see me in one month, MRI for interventional injection planning if no better.  Light duty at work.

## 2013-07-08 ENCOUNTER — Ambulatory Visit: Payer: Managed Care, Other (non HMO) | Admitting: Physician Assistant

## 2013-07-08 ENCOUNTER — Ambulatory Visit: Payer: BC Managed Care – PPO | Admitting: Physician Assistant

## 2013-07-08 MED ORDER — KETOROLAC TROMETHAMINE 30 MG/ML IJ SOLN
30.0000 mg | Freq: Once | INTRAMUSCULAR | Status: AC
  Administered 2013-07-08: 30 mg via INTRAMUSCULAR

## 2013-07-08 NOTE — Addendum Note (Signed)
Addended by: Pixie Casino on: 07/08/2013 02:38 PM   Modules accepted: Orders

## 2013-07-11 ENCOUNTER — Telehealth: Payer: Self-pay | Admitting: *Deleted

## 2013-07-11 ENCOUNTER — Encounter: Payer: Self-pay | Admitting: Physician Assistant

## 2013-07-11 ENCOUNTER — Ambulatory Visit (INDEPENDENT_AMBULATORY_CARE_PROVIDER_SITE_OTHER): Payer: BC Managed Care – PPO

## 2013-07-11 ENCOUNTER — Ambulatory Visit (INDEPENDENT_AMBULATORY_CARE_PROVIDER_SITE_OTHER): Payer: BC Managed Care – PPO | Admitting: Physician Assistant

## 2013-07-11 ENCOUNTER — Other Ambulatory Visit: Payer: Self-pay | Admitting: Physician Assistant

## 2013-07-11 VITALS — BP 126/79 | HR 81 | Wt 229.0 lb

## 2013-07-11 DIAGNOSIS — L303 Infective dermatitis: Secondary | ICD-10-CM

## 2013-07-11 DIAGNOSIS — Z8614 Personal history of Methicillin resistant Staphylococcus aureus infection: Secondary | ICD-10-CM

## 2013-07-11 DIAGNOSIS — S6992XA Unspecified injury of left wrist, hand and finger(s), initial encounter: Secondary | ICD-10-CM

## 2013-07-11 DIAGNOSIS — S62639A Displaced fracture of distal phalanx of unspecified finger, initial encounter for closed fracture: Secondary | ICD-10-CM

## 2013-07-11 DIAGNOSIS — L0889 Other specified local infections of the skin and subcutaneous tissue: Secondary | ICD-10-CM

## 2013-07-11 DIAGNOSIS — W230XXA Caught, crushed, jammed, or pinched between moving objects, initial encounter: Secondary | ICD-10-CM

## 2013-07-11 DIAGNOSIS — Z131 Encounter for screening for diabetes mellitus: Secondary | ICD-10-CM

## 2013-07-11 MED ORDER — DOXYCYCLINE HYCLATE 100 MG PO CAPS: 100.0000 mg | ORAL_CAPSULE | Freq: Two times a day (BID) | ORAL | Status: DC

## 2013-07-11 MED ORDER — TRAMADOL HCL 50 MG PO TABS: 50.0000 mg | ORAL_TABLET | Freq: Four times a day (QID) | ORAL | Status: DC | PRN

## 2013-07-11 MED ORDER — MUPIROCIN 2 % EX OINT: TOPICAL_OINTMENT | CUTANEOUS | Status: DC

## 2013-07-11 MED ORDER — CLOBETASOL PROPIONATE 0.05 % EX GEL: 1.0000 "application " | Freq: Two times a day (BID) | CUTANEOUS | Status: DC

## 2013-07-11 NOTE — Telephone Encounter (Signed)
Pt states he has taken tramadol before.

## 2013-07-11 NOTE — Progress Notes (Signed)
  Subjective:    Patient ID: Theodore Walker, male    DOB: [birth year], 27 y.o.   MRN: [id number]  HPI Patient presents to the clinic today with multiple concerns.   Rash- He has had bumps pop up on his hands, arms, legs for numerous years. They are very itchy. Worst in the winter and best in the summer. Tiny pus-filled bumps will pop up break and then eventually dry up and go away. He uses lotions but not tried anything else. It was told it was folliculitis one time but treatment never stopped outbreaks for long. He does have a hx of boil that was MRSA. He is concerned that he has an outbreak or something.   Left middle finger this Saturday was injured as his truck hood fell on his finger. It has been very painful but voltaren and ice have helped. Now the pain is only when you move his finger.   Pt is concerned because both parents have diabetes and he wants to make sure he doesn't have diabetes.    Review of Systems     Objective:   Physical Exam  Constitutional: He appears well-developed and well-nourished.  Musculoskeletal:  Pain with palpation over DIP joint of left middle finger. ROM passively is very painful. Bruising and slight swelling is present. No pain down left middle finger or into the hand. Hand grip normal.   Skin:  Small pustules and small erythematous papules over bilateral legs, arms, and hands.   Psychiatric: He has a normal mood and affect. His behavior is normal.          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 I did go ahead and treat for 10 days with doxycycline for folliculitis as well as gave bactroban to use intranasally for any staph colonization. i instructed patient to start using clobetasol cream and mix with bactroban on bumps with they present. ABx should get rid of any infection currently, bactroban should help to present in staph contributing to bumps, and clobetsol should help with eczema.   Left middle DIP trauma-  Will get xrays to see if fractured.   Results: Small nondisplaced fracture at the ulnar base of the 3rd distal  Phalanx. Pt aware to come in for splint. Follow up as needed.    Screening for diabetes- reassured pt that last fasting BMP was great. Recommened keep a healthy balanced diet and regular exercise. Will check annually.

## 2013-07-11 NOTE — Telephone Encounter (Signed)
Has pt taken tramadol before. Watch for any reaction such as itching or hives. If does have something that is in morphine. Should not have same reaction but take benadryl and stop taking medication if does.

## 2013-07-11 NOTE — Telephone Encounter (Signed)
Pt would like tramadol send to [facility] in [place]

## 2013-07-18 ENCOUNTER — Encounter: Payer: Self-pay | Admitting: Physician Assistant

## 2013-07-18 ENCOUNTER — Ambulatory Visit (INDEPENDENT_AMBULATORY_CARE_PROVIDER_SITE_OTHER): Payer: BC Managed Care – PPO | Admitting: Physician Assistant

## 2013-07-18 ENCOUNTER — Encounter: Payer: BC Managed Care – PPO | Admitting: Physician Assistant

## 2013-07-18 VITALS — BP 125/79 | HR 81 | Wt 226.0 lb

## 2013-07-18 DIAGNOSIS — M1710 Unilateral primary osteoarthritis, unspecified knee: Secondary | ICD-10-CM

## 2013-07-18 DIAGNOSIS — M25562 Pain in left knee: Secondary | ICD-10-CM

## 2013-07-18 DIAGNOSIS — M1712 Unilateral primary osteoarthritis, left knee: Secondary | ICD-10-CM

## 2013-07-18 NOTE — Progress Notes (Signed)
This encounter was created in error - please disregard.

## 2013-07-18 NOTE — Progress Notes (Signed)
  Subjective:    Patient ID: Theodore Walker, male    DOB: 05-May-1986, 27 y.o.   MRN: [id number]  HPI Patient presents to the clinic with left knee pain. Pain has been ongoing for years just seems to be worsening and would like to see if anything else would help. He has a brace but it is very bulky and not able to wear at work. He has had imaging in the last 2 years that showed significant arthritis. He has gotten at least one cortisone shot that did help for about 6 months. He has a hx of lots of injuries to his knees. He has had fractures as well as 2 arthroscopies to clean up debri. Pt uses a lot of icy hot and ibuprofen. Mobic is very hard on his stomach and he cannot tolerate. Pain is worse in morning and at night. He has been really active with his children recently and thinks that might be why pain is worse. Per pt ultram does help for break through pain.   Review of Systems     Objective:   Physical Exam  Constitutional: He appears well-developed and well-nourished.  Musculoskeletal:  Left knee flexion to extension 120 degrees to 0 degrees. 3+creptius. No joint tenderness to palpation. Neg McMurrays/anterior drawer. Strength 5/5.           Assessment & Plan:  Left knee osteoarthritis- Placed in knee sleeve for stability. Discussed home rehab to help strengthen muscle around joint. Gave samples of celebrex for pt to try and savings card if works. Shouldn't be as hard on stomach. Pt has tramadol to use for breakthrough pain. Discussed icing when gets home from work. talked about injection. Pt will consider if not improving. Will get imaging in near future. Pt declined today.

## 2013-07-18 NOTE — Patient Instructions (Addendum)
Glucosamine chondrotin to use for knee pain.   Knee Exercises EXERCISES RANGE OF MOTION(ROM) AND STRETCHING EXERCISES These exercises may help you when beginning to rehabilitate your injury. Your symptoms may resolve with or without further involvement from your physician, physical therapist or athletic trainer. While completing these exercises, remember:   Restoring tissue flexibility helps normal motion to return to the joints. This allows healthier, less painful movement and activity.  An effective stretch should be held for at least 30 seconds.  A stretch should never be painful. You should only feel a gentle lengthening or release in the stretched tissue. STRETCH - Knee Extension, Prone  Lie on your stomach on a firm surface, such as a bed or countertop. Place your right / left knee and leg just beyond the edge of the surface. You may wish to place a towel under the far end of your right / left thigh for comfort.  Relax your leg muscles and allow gravity to straighten your knee. Your clinician may advise you to add an ankle weight if more resistance is helpful for you.  You should feel a stretch in the back of your right / left knee. Hold this position for __________ seconds. Repeat __________ times. Complete this stretch __________ times per day. * Your physician, physical therapist or athletic trainer may ask you to add ankle weight to enhance your stretch.  RANGE OF MOTION - Knee Flexion, Active  Lie on your back with both knees straight. (If this causes back discomfort, bend your opposite knee, placing your foot flat on the floor.)  Slowly slide your heel back toward your buttocks until you feel a gentle stretch in the front of your knee or thigh.  Hold for __________ seconds. Slowly slide your heel back to the starting position. Repeat __________ times. Complete this exercise __________ times per day.  STRETCH - Quadriceps, Prone   Lie on your stomach on a firm surface, such  as a bed or padded floor.  Bend your right / left knee and grasp your ankle. If you are unable to reach, your ankle or pant leg, use a belt around your foot to lengthen your reach.  Gently pull your heel toward your buttocks. Your knee should not slide out to the side. You should feel a stretch in the front of your thigh and/or knee.  Hold this position for __________ seconds. Repeat __________ times. Complete this stretch __________ times per day.  STRETCH  Hamstrings, Supine   Lie on your back. Loop a belt or towel over the ball of your right / left foot.  Straighten your right / left knee and slowly pull on the belt to raise your leg. Do not allow the right / left knee to bend. Keep your opposite leg flat on the floor.  Raise the leg until you feel a gentle stretch behind your right / left knee or thigh. Hold this position for __________ seconds. Repeat __________ times. Complete this stretch __________ times per day.  STRENGTHENING EXERCISES These exercises may help you when beginning to rehabilitate your injury. They may resolve your symptoms with or without further involvement from your physician, physical therapist or athletic trainer. While completing these exercises, remember:   Muscles can gain both the endurance and the strength needed for everyday activities through controlled exercises.  Complete these exercises as instructed by your physician, physical therapist or athletic trainer. Progress the resistance and repetitions only as guided.  You may experience muscle soreness or fatigue, but the  pain or discomfort you are trying to eliminate should never worsen during these exercises. If this pain does worsen, stop and make certain you are following the directions exactly. If the pain is still present after adjustments, discontinue the exercise until you can discuss the trouble with your clinician. STRENGTH - Quadriceps, Isometrics  Lie on your back with your right / left leg  extended and your opposite knee bent.  Gradually tense the muscles in the front of your right / left thigh. You should see either your knee cap slide up toward your hip or increased dimpling just above the knee. This motion will push the back of the knee down toward the floor/mat/bed on which you are lying.  Hold the muscle as tight as you can without increasing your pain for __________ seconds.  Relax the muscles slowly and completely in between each repetition. Repeat __________ times. Complete this exercise __________ times per day.  STRENGTH - Quadriceps, Short Arcs   Lie on your back. Place a __________ inch towel roll under your knee so that the knee slightly bends.  Raise only your lower leg by tightening the muscles in the front of your thigh. Do not allow your thigh to rise.  Hold this position for __________ seconds. Repeat __________ times. Complete this exercise __________ times per day.  OPTIONAL ANKLE WEIGHTS: Begin with ____________________, but DO NOT exceed ____________________. Increase in 1 pound/0.5 kilogram increments.  STRENGTH - Quadriceps, Straight Leg Raises  Quality counts! Watch for signs that the quadriceps muscle is working to insure you are strengthening the correct muscles and not "cheating" by substituting with healthier muscles.  Lay on your back with your right / left leg extended and your opposite knee bent.  Tense the muscles in the front of your right / left thigh. You should see either your knee cap slide up or increased dimpling just above the knee. Your thigh may even quiver.  Tighten these muscles even more and raise your leg 4 to 6 inches off the floor. Hold for __________ seconds.  Keeping these muscles tense, lower your leg.  Relax the muscles slowly and completely in between each repetition. Repeat __________ times. Complete this exercise __________ times per day.  STRENGTH - Hamstring, Curls  Lay on your stomach with your legs extended.  (If you lay on a bed, your feet may hang over the edge.)  Tighten the muscles in the back of your thigh to bend your right / left knee up to 90 degrees. Keep your hips flat on the bed/floor.  Hold this position for __________ seconds.  Slowly lower your leg back to the starting position. Repeat __________ times. Complete this exercise __________ times per day.  OPTIONAL ANKLE WEIGHTS: Begin with ____________________, but DO NOT exceed ____________________. Increase in 1 pound/0.5 kilogram increments.  STRENGTH  Quadriceps, Squats  Stand in a door frame so that your feet and knees are in line with the frame.  Use your hands for balance, not support, on the frame.  Slowly lower your weight, bending at the hips and knees. Keep your lower legs upright so that they are parallel with the door frame. Squat only within the range that does not increase your knee pain. Never let your hips drop below your knees.  Slowly return upright, pushing with your legs, not pulling with your hands. Repeat __________ times. Complete this exercise __________ times per day.  STRENGTH - Quadriceps, Wall Slides  Follow guidelines for form closely. Increased knee pain often results  from poorly placed feet or knees.  Lean against a smooth wall or door and walk your feet out 18-24 inches. Place your feet hip-width apart.  Slowly slide down the wall or door until your knees bend __________ degrees.* Keep your knees over your heels, not your toes, and in line with your hips, not falling to either side.  Hold for __________ seconds. Stand up to rest for __________ seconds in between each repetition. Repeat __________ times. Complete this exercise __________ times per day. * Your physician, physical therapist or athletic trainer will alter this angle based on your symptoms and progress. Document Released: 09/03/2005 Document Revised: 01/12/2012 Document Reviewed: 02/01/2009 [facility] Patient Information 2014 [place],  [place].

## 2013-07-20 ENCOUNTER — Telehealth: Payer: Self-pay | Admitting: *Deleted

## 2013-07-20 ENCOUNTER — Other Ambulatory Visit: Payer: Self-pay | Admitting: *Deleted

## 2013-07-20 MED ORDER — TRAMADOL HCL 50 MG PO TABS: 50.0000 mg | ORAL_TABLET | Freq: Four times a day (QID) | ORAL | Status: DC | PRN

## 2013-07-20 NOTE — Telephone Encounter (Signed)
Pt states you mentioned calling in some tramadol for him. He is asking for the medication now and would like to know if you can do it for a couple of months supply.  Meyer Cory, LPN

## 2013-07-20 NOTE — Telephone Encounter (Signed)
Pt came in and gave rx.

## 2013-07-22 ENCOUNTER — Telehealth: Payer: Self-pay | Admitting: *Deleted

## 2013-07-22 ENCOUNTER — Other Ambulatory Visit: Payer: Self-pay | Admitting: *Deleted

## 2013-07-22 MED ORDER — TRAMADOL HCL 50 MG PO TABS: 50.0000 mg | ORAL_TABLET | Freq: Four times a day (QID) | ORAL | Status: DC | PRN
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
## 2013-07-22 NOTE — Telephone Encounter (Signed)
Pt notified. Abeer Deskins, LPN  

## 2013-07-24 ENCOUNTER — Encounter: Payer: Self-pay | Admitting: *Deleted

## 2013-07-24 ENCOUNTER — Emergency Department (INDEPENDENT_AMBULATORY_CARE_PROVIDER_SITE_OTHER)
Admission: EM | Admit: 2013-07-24 | Discharge: 2013-07-24 | Disposition: A | Discharge status: Home / Self Care | Payer: BC Managed Care – PPO | Source: Home / Self Care | Attending: Emergency Medicine | Admitting: Emergency Medicine

## 2013-07-24 ENCOUNTER — Emergency Department (INDEPENDENT_AMBULATORY_CARE_PROVIDER_SITE_OTHER): Payer: BC Managed Care – PPO

## 2013-07-24 DIAGNOSIS — M79609 Pain in unspecified limb: Secondary | ICD-10-CM

## 2013-07-24 DIAGNOSIS — S93409A Sprain of unspecified ligament of unspecified ankle, initial encounter: Secondary | ICD-10-CM

## 2013-07-24 DIAGNOSIS — S9031XA Contusion of right foot, initial encounter: Secondary | ICD-10-CM

## 2013-07-24 DIAGNOSIS — M25579 Pain in unspecified ankle and joints of unspecified foot: Secondary | ICD-10-CM

## 2013-07-24 DIAGNOSIS — S93401A Sprain of unspecified ligament of right ankle, initial encounter: Secondary | ICD-10-CM

## 2013-07-24 MED ORDER — HYDROCODONE-ACETAMINOPHEN 5-325 MG PO TABS: 1.0000 | ORAL_TABLET | Freq: Four times a day (QID) | ORAL | Status: DC | PRN

## 2013-07-24 NOTE — ED Provider Notes (Signed)
CSN: [id number]     Arrival date & time 07/24/13  1132 History   First MD Initiated Contact with Patient 07/24/13 1136     Chief Complaint  Patient presents with  . Ankle Pain    Patient is a 27 y.o. male presenting with ankle pain. The history is provided by the patient.  Ankle Pain Location:  Ankle and foot (Lateral aspect) Time since incident:  2 hours Injury: yes (While at home, repairing his roof, he jumped off the roof, following one-story, landing on right lower extremity, and he immediately heard a pop)   Mechanism of injury: fall   Fall:    Fall occurred:  From a roof   Height of fall:  10 feet   Impact surface:  Unable to specify   Point of impact: Right foot/ankle.   Entrapped after fall: no   Ankle location:  R ankle Foot location:  R foot Pain details:    Quality:  Sharp   Radiates to:  Does not radiate   Pain severity now: 8 out of possible 10.   Onset quality:  Sudden   Duration:  2 hours   Progression:  Unchanged Chronicity:  New Foreign body present:  No foreign bodies Prior injury to area:  No Relieved by:  Rest Exacerbated by: Attempt at weightbearing. Associated symptoms: decreased ROM   Associated symptoms: no back pain, no fatigue, no fever, no itching, no muscle weakness, no neck pain and no numbness     Past Medical History  Diagnosis Date  . GERD (gastroesophageal reflux disease)   . IBS (irritable bowel syndrome)   . Hiatal hernia   . Spastic colon   . Appendicitis   . GERD (gastroesophageal reflux disease)    Past Surgical History  Procedure Laterality Date  . Knee surgery    . Nose surgery    . Vasectomy    . Appendectomy     Family History  Problem Relation Age of Onset  . Hypertension Mother   . Diabetes Mother    History  Substance Use Topics  . Smoking status: Never Smoker   . Smokeless tobacco: Current User    Types: Chew  . Alcohol Use: No    Review of Systems  Constitutional: Negative for fever and fatigue.  HENT:  Negative for neck pain.   Musculoskeletal: Negative for back pain.  Skin: Negative.  Negative for itching.  Neurological: Negative for dizziness, seizures, syncope and facial asymmetry.  All other systems reviewed and are negative.   denies any head or neck or back complaints  Allergies  Morphine and related  Home Medications   Current Outpatient Rx  Name  Route  Sig  Dispense  Refill  . clobetasol (TEMOVATE) 0.05 % GEL   Topical   Apply 1 application topically 2 (two) times daily.   60 each   1   . doxycycline (VIBRAMYCIN) 100 MG capsule   Oral   Take 1 capsule (100 mg total) by mouth 2 (two) times daily. For 10 days.   20 capsule   0   . HYDROcodone-acetaminophen (NORCO/VICODIN) 5-325 MG per tablet   Oral   Take 1-2 tablets by mouth every 6 (six) hours as needed for pain. Take with food.   10 tablet   0   . ibuprofen (ADVIL,MOTRIN) 800 MG tablet   Oral   Take 1 tablet (800 mg total) by mouth 3 (three) times daily. Take with food   21 tablet   0   .  mupirocin ointment (BACTROBAN) 2 %      Use nasally application three times a day for 4 days.   30 g   0    BP 111/76  Pulse 91  Temp(Src) 98.3 F (36.8 C) (Oral)  Resp 16  Ht 6\' 3"  (1.905 m)  Wt 221 lb (100.245 kg)  BMI 27.62 kg/m2  SpO2 98% Physical Exam  Nursing note and vitals reviewed. Constitutional: He is oriented to person, place, and time. He appears well-developed and well-nourished. No distress.  HENT:  Head: Normocephalic and atraumatic.  Eyes: Conjunctivae and EOM are normal. Pupils are equal, round, and reactive to light. No scleral icterus.  Neck: Normal range of motion.  Cardiovascular: Normal rate.   Pulmonary/Chest: Effort normal.  Abdominal: He exhibits no distension.  Musculoskeletal: Normal range of motion.  Neurological: He is alert and oriented to person, place, and time.  Skin: Skin is warm.  No ecchymosis or open wound or abrasion or any lesions  Psychiatric: He has a normal  mood and affect.    right ankle/foot: Decreased ROM, +TTP right lateral foot, minimally tender fifth metatarsal.. Extreme tenderness to palpation lateral malleolus and especially anterior talofibular ligament area.   No TTP medial malleolus, navicular, , calcaneus, Achilles, proximal fibula.  Minimal swelling.  No ecchymoses.  Distal neurovascular status is intact.  Right ankle anterior drawer sign equivocally positive, but exam difficult and limited because of severe pain with this maneuver. Severe pain is induced by right ankle inversion.  Remainder of right lower extremity exam intact. Right knee and right hip normal. Left lower extremity exam normal. No head or neck injury noted . Neurologic exam intact. Motor and sensory and DTRs normal.  ED Course  Procedures (including critical care time) Labs Review Labs Reviewed - No data to display Imaging Review Dg Ankle Complete Right  07/24/2013   CLINICAL DATA:  Right foot/ankle pain after landing on it after a jump today.  EXAM: RIGHT ANKLE - COMPLETE 3+ VIEW  COMPARISON:  None.  FINDINGS: There is no evidence of fracture, dislocation, or joint effusion. There is no evidence of arthropathy or other focal bone abnormality. Soft tissues are unremarkable.  IMPRESSION: Negative.   Electronically Signed   By: Signa Kell M.D.   On: 07/24/2013 13:03   Dg Foot Complete Right  07/24/2013   CLINICAL DATA:  Right foot/ ankle pain  EXAM: RIGHT FOOT COMPLETE - 3+ VIEW  COMPARISON:  None.  FINDINGS: No fracture or dislocation is seen.  The joint spaces are preserved.  The visualized soft tissues are unremarkable.  IMPRESSION: No fracture or dislocation is seen.   Electronically Signed   By: Charline Bills M.D.   On: 07/24/2013 12:50    MDM   1. Severe ankle sprain, right, initial encounter   2. Contusion of right foot, initial encounter    Likely has severe grade 2 right ankle sprain. And severe right lateral foot contusion. I have reviewed x-rays  right foot and ankle, no fracture or dislocation seen. I discussed this with patient. Questions invited and answered.  Risks, benefits, alternatives discussed. Right ankle Ace bandage applied. RICE. Right ankle/foot Cam Walker applied. Rx: Vicodin. #10. No refills.--For moderate to severe pain. Precautions discussed. (Note that his chart indicates allergy to morphine from episode postop in the past, but he states these were mental status changes and were not allergic reaction. He states he has taken Vicodin in the past without any problems or side effects) For mild to  moderate pain, ibuprofen OTC up to 800 mg 3 times a day p.c.  He will call and make an appointment for followup with his orthopedist at [facility] orth[facility], in 3-5 days.--Or sooner if worse or any new symptoms.  Precautions discussed. Red flags discussed. Questions invited and answered. Patient voiced understanding and agreement.      Lajean Manes, MD 07/24/13 [id number]

## 2013-07-24 NOTE — ED Notes (Signed)
Patient c/o ankle pain states he injured it this morning jumped off a roof and heard a pop.

## 2013-07-27 ENCOUNTER — Telehealth: Payer: Self-pay | Admitting: *Deleted

## 2013-07-27 ENCOUNTER — Other Ambulatory Visit: Payer: Self-pay | Admitting: Physician Assistant

## 2013-07-27 MED ORDER — TRAMADOL HCL 50 MG PO TABS: 50.0000 mg | ORAL_TABLET | Freq: Four times a day (QID) | ORAL | Status: DC | PRN

## 2013-07-27 NOTE — Telephone Encounter (Signed)
Pt has LM requesting a refill on Tramadol.

## 2013-07-27 NOTE — Telephone Encounter (Signed)
YOu should only be using tramadol as needed up to every 6 hours. My recommendation is no more than twice a day. Use in conjunction with NSAIDS. I will give you 60 more tabs. You were also given vicodin 3 days ago. Should not need pains again until over a month.

## 2013-07-28 NOTE — Telephone Encounter (Signed)
Pt informed.  Theodore Sherwood, LPN  

## 2013-08-19 ENCOUNTER — Ambulatory Visit (INDEPENDENT_AMBULATORY_CARE_PROVIDER_SITE_OTHER): Payer: Managed Care, Other (non HMO) | Admitting: Physician Assistant

## 2013-08-19 DIAGNOSIS — L740 Miliaria rubra: Secondary | ICD-10-CM

## 2013-08-19 DIAGNOSIS — M5430 Sciatica, unspecified side: Secondary | ICD-10-CM

## 2013-08-19 MED ORDER — DOXYCYCLINE HYCLATE 100 MG PO CAPS: 100.0000 mg | ORAL_CAPSULE | Freq: Two times a day (BID) | ORAL | Status: DC

## 2013-08-19 MED ORDER — HYDROCODONE-ACETAMINOPHEN 5-325 MG PO TABS: 1.0000 | ORAL_TABLET | Freq: Four times a day (QID) | ORAL | Status: DC | PRN

## 2013-08-19 MED ORDER — KETOROLAC TROMETHAMINE 60 MG/2ML IM SOLN
60.0000 mg | Freq: Once | INTRAMUSCULAR | Status: AC
  Administered 2013-08-19: 60 mg via INTRAMUSCULAR

## 2013-08-19 MED ORDER — CYCLOBENZAPRINE HCL 10 MG PO TABS: 10.0000 mg | ORAL_TABLET | Freq: Three times a day (TID) | ORAL | Status: DC | PRN
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
## 2013-08-19 NOTE — Patient Instructions (Addendum)
Heat Rash Heat rash (miliaria) is a skin irritation caused by heavy sweating during hot, humid weather. It results from blockage of the sweat glands on our body. It can occur at any age. It is most common in young children whose sweat ducts are still developing or are not fully developed. Tight clothing may make the condition worse. Heat rash can look like small blisters (vesicles) that break open easily with bathing or minimal pressure. These blisters are found most commonly on the face, upper trunk of children and the trunk of adults. It can also look like a red cluster of red bumps or pimples (pustules). These usually itch and can also sometimes burn. It is more likely to occur on the neck and upper chest, in the groin, under the breasts, and in elbow creases. HOME CARE INSTRUCTIONS   The best treatment for heat rash is to provide a cooler, less humid environment where sweating is much decreased.  Keep the affected area dry. Dusting powder (cornstarch powder, baby powder) may be used to increase comfort. Avoid using ointments or creams. They keep the skin warm and moist and may make the condition worse.  Treating heat rash is simple and usually does not require medical assistance. SEEK MEDICAL CARE IF:   There is any evidence of infection such as fever, redness, swelling.  There is discomfort such as pain.  The skin lesions do no resolve with cooler, dryer environment. MAKE SURE YOU:   Understand these instructions.  Will watch your condition.  Will get help right away if you are not doing well or get worse. Document Released: 10/08/2009 Document Revised: 01/12/2012 Document Reviewed: 10/08/2009 [facility] Patient Information 2014 [place], [place]. Low Back Sprain with Rehab  A sprain is an injury in which a ligament is torn. The ligaments of the lower back are vulnerable to sprains. However, they are strong and require great force to be injured. These ligaments are important for  stabilizing the spinal column. Sprains are classified into three categories. Grade 1 sprains cause pain, but the tendon is not lengthened. Grade 2 sprains include a lengthened ligament, due to the ligament being stretched or partially ruptured. With grade 2 sprains there is still function, although the function may be decreased. Grade 3 sprains involve a complete tear of the tendon or muscle, and function is usually impaired. SYMPTOMS   Severe pain in the lower back.  Sometimes, a feeling of a "pop," "snap," or tear, at the time of injury.  Tenderness and sometimes swelling at the injury site.  Uncommonly, bruising (contusion) within 48 hours of injury.  Muscle spasms in the back. CAUSES  Low back sprains occur when a force is placed on the ligaments that is greater than they can handle. Common causes of injury include:  Performing a stressful act while off-balance.  Repetitive stressful activities that involve movement of the lower back.  Direct hit (trauma) to the lower back. RISK INCREASES WITH:  Contact sports (football, wrestling).  Collisions (major skiing accidents).  Sports that require throwing or lifting (baseball, weightlifting).  Sports involving twisting of the spine (gymnastics, diving, tennis, golf).  Poor strength and flexibility.  Inadequate protection.  Previous back injury or surgery (especially fusion). PREVENTION  Wear properly fitted and padded protective equipment.  Warm up and stretch properly before activity.  Allow for adequate recovery between workouts.  Maintain physical fitness:  Strength, flexibility, and endurance.  Cardiovascular fitness.  Maintain a healthy body weight. PROGNOSIS  If treated properly, low back sprains  usually heal with non-surgical treatment. The length of time for healing depends on the severity of the injury.  RELATED COMPLICATIONS   Recurring symptoms, resulting in a chronic problem.  Chronic inflammation and  pain in the low back.  Delayed healing or resolution of symptoms, especially if activity is resumed too soon.  Prolonged impairment.  Unstable or arthritic joints of the low back. TREATMENT  Treatment first involves the use of ice and medicine, to reduce pain and inflammation. The use of strengthening and stretching exercises may help reduce pain with activity. These exercises may be performed at home or with a therapist. Severe injuries may require referral to a therapist for further evaluation and treatment, such as ultrasound. Your caregiver may advise that you wear a back brace or corset, to help reduce pain and discomfort. Often, prolonged bed rest results in greater harm then benefit. Corticosteroid injections may be recommended. However, these should be reserved for the most serious cases. It is important to avoid using your back when lifting objects. At night, sleep on your back on a firm mattress, with a pillow placed under your knees. If non-surgical treatment is unsuccessful, surgery may be needed.  MEDICATION   If pain medicine is needed, nonsteroidal anti-inflammatory medicines (aspirin and ibuprofen), or other minor pain relievers (acetaminophen), are often advised.  Do not take pain medicine for 7 days before surgery.  Prescription pain relievers may be given, if your caregiver thinks they are needed. Use only as directed and only as much as you need.  Ointments applied to the skin may be helpful.  Corticosteroid injections may be given by your caregiver. These injections should be reserved for the most serious cases, because they may only be given a certain number of times. HEAT AND COLD  Cold treatment (icing) should be applied for 10 to 15 minutes every 2 to 3 hours for inflammation and pain, and immediately after activity that aggravates your symptoms. Use ice packs or an ice massage.  Heat treatment may be used before performing stretching and strengthening activities  prescribed by your caregiver, physical therapist, or athletic trainer. Use a heat pack or a warm water soak. SEEK MEDICAL CARE IF:   Symptoms get worse or do not improve in 2 to 4 weeks, despite treatment.  You develop numbness or weakness in either leg.  You lose bowel or bladder function.  Any of the following occur after surgery: fever, increased pain, swelling, redness, drainage of fluids, or bleeding in the affected area.  New, unexplained symptoms develop. (Drugs used in treatment may produce side effects.) EXERCISES  RANGE OF MOTION (ROM) AND STRETCHING EXERCISES - Low Back Sprain Most people with lower back pain will find that their symptoms get worse with excessive bending forward (flexion) or arching at the lower back (extension). The exercises that will help resolve your symptoms will focus on the opposite motion.  Your physician, physical therapist or athletic trainer will help you determine which exercises will be most helpful to resolve your lower back pain. Do not complete any exercises without first consulting with your caregiver. Discontinue any exercises which make your symptoms worse, until you speak to your caregiver. If you have pain, numbness or tingling which travels down into your buttocks, leg or foot, the goal of the therapy is for these symptoms to move closer to your back and eventually resolve. Sometimes, these leg symptoms will get better, but your lower back pain may worsen. This is often an indication of progress in your  rehabilitation. Be very alert to any changes in your symptoms and the activities in which you participated in the 24 hours prior to the change. Sharing this information with your caregiver will allow him or her to most efficiently treat your condition. These exercises may help you when beginning to rehabilitate your injury. Your symptoms may resolve with or without further involvement from your physician, physical therapist or athletic trainer. While  completing these exercises, remember:   Restoring tissue flexibility helps normal motion to return to the joints. This allows healthier, less painful movement and activity.  An effective stretch should be held for at least 30 seconds.  A stretch should never be painful. You should only feel a gentle lengthening or release in the stretched tissue. FLEXION RANGE OF MOTION AND STRETCHING EXERCISES: STRETCH  Flexion, Single Knee to Chest   Lie on a firm bed or floor with both legs extended in front of you.  Keeping one leg in contact with the floor, bring your opposite knee to your chest. Hold your leg in place by either grabbing behind your thigh or at your knee.  Pull until you feel a gentle stretch in your low back. Hold __________ seconds.  Slowly release your grasp and repeat the exercise with the opposite side. Repeat __________ times. Complete this exercise __________ times per day.  STRETCH  Flexion, Double Knee to Chest  Lie on a firm bed or floor with both legs extended in front of you.  Keeping one leg in contact with the floor, bring your opposite knee to your chest.  Tense your stomach muscles to support your back and then lift your other knee to your chest. Hold your legs in place by either grabbing behind your thighs or at your knees.  Pull both knees toward your chest until you feel a gentle stretch in your low back. Hold __________ seconds.  Tense your stomach muscles and slowly return one leg at a time to the floor. Repeat __________ times. Complete this exercise __________ times per day.  STRETCH  Low Trunk Rotation  Lie on a firm bed or floor. Keeping your legs in front of you, bend your knees so they are both pointed toward the ceiling and your feet are flat on the floor.  Extend your arms out to the side. This will stabilize your upper body by keeping your shoulders in contact with the floor.  Gently and slowly drop both knees together to one side until you feel  a gentle stretch in your low back. Hold for __________ seconds.  Tense your stomach muscles to support your lower back as you bring your knees back to the starting position. Repeat the exercise to the other side. Repeat __________ times. Complete this exercise __________ times per day  EXTENSION RANGE OF MOTION AND FLEXIBILITY EXERCISES: STRETCH  Extension, Prone on Elbows   Lie on your stomach on the floor, a bed will be too soft. Place your palms about shoulder width apart and at the height of your head.  Place your elbows under your shoulders. If this is too painful, stack pillows under your chest.  Allow your body to relax so that your hips drop lower and make contact more completely with the floor.  Hold this position for __________ seconds.  Slowly return to lying flat on the floor. Repeat __________ times. Complete this exercise __________ times per day.  RANGE OF MOTION  Extension, Prone Press Ups  Lie on your stomach on the floor, a bed will  be too soft. Place your palms about shoulder width apart and at the height of your head.  Keeping your back as relaxed as possible, slowly straighten your elbows while keeping your hips on the floor. You may adjust the placement of your hands to maximize your comfort. As you gain motion, your hands will come more underneath your shoulders.  Hold this position __________ seconds.  Slowly return to lying flat on the floor. Repeat __________ times. Complete this exercise __________ times per day.  RANGE OF MOTION- Quadruped, Neutral Spine   Assume a hands and knees position on a firm surface. Keep your hands under your shoulders and your knees under your hips. You may place padding under your knees for comfort.  Drop your head and point your tailbone toward the ground below you. This will round out your lower back like an angry cat. Hold this position for __________ seconds.  Slowly lift your head and release your tail bone so that your  back sags into a large arch, like an old horse.  Hold this position for __________ seconds.  Repeat this until you feel limber in your low back.  Now, find your "sweet spot." This will be the most comfortable position somewhere between the two previous positions. This is your neutral spine. Once you have found this position, tense your stomach muscles to support your low back.  Hold this position for __________ seconds. Repeat __________ times. Complete this exercise __________ times per day.  STRENGTHENING EXERCISES - Low Back Sprain These exercises may help you when beginning to rehabilitate your injury. These exercises should be done near your "sweet spot." This is the neutral, low-back arch, somewhere between fully rounded and fully arched, that is your least painful position. When performed in this safe range of motion, these exercises can be used for people who have either a flexion or extension based injury. These exercises may resolve your symptoms with or without further involvement from your physician, physical therapist or athletic trainer. While completing these exercises, remember:   Muscles can gain both the endurance and the strength needed for everyday activities through controlled exercises.  Complete these exercises as instructed by your physician, physical therapist or athletic trainer. Increase the resistance and repetitions only as guided.  You may experience muscle soreness or fatigue, but the pain or discomfort you are trying to eliminate should never worsen during these exercises. If this pain does worsen, stop and make certain you are following the directions exactly. If the pain is still present after adjustments, discontinue the exercise until you can discuss the trouble with your caregiver. STRENGTHENING Deep Abdominals, Pelvic Tilt   Lie on a firm bed or floor. Keeping your legs in front of you, bend your knees so they are both pointed toward the ceiling and your feet  are flat on the floor.  Tense your lower abdominal muscles to press your low back into the floor. This motion will rotate your pelvis so that your tail bone is scooping upwards rather than pointing at your feet or into the floor. With a gentle tension and even breathing, hold this position for __________ seconds. Repeat __________ times. Complete this exercise __________ times per day.  STRENGTHENING  Abdominals, Crunches   Lie on a firm bed or floor. Keeping your legs in front of you, bend your knees so they are both pointed toward the ceiling and your feet are flat on the floor. Cross your arms over your chest.  Slightly tip your chin down  without bending your neck.  Tense your abdominals and slowly lift your trunk high enough to just clear your shoulder blades. Lifting higher can put excessive stress on the lower back and does not further strengthen your abdominal muscles.  Control your return to the starting position. Repeat __________ times. Complete this exercise __________ times per day.  STRENGTHENING  Quadruped, Opposite UE/LE Lift   Assume a hands and knees position on a firm surface. Keep your hands under your shoulders and your knees under your hips. You may place padding under your knees for comfort.  Find your neutral spine and gently tense your abdominal muscles so that you can maintain this position. Your shoulders and hips should form a rectangle that is parallel with the floor and is not twisted.  Keeping your trunk steady, lift your right hand no higher than your shoulder and then your left leg no higher than your hip. Make sure you are not holding your breath. Hold this position for __________ seconds.  Continuing to keep your abdominal muscles tense and your back steady, slowly return to your starting position. Repeat with the opposite arm and leg. Repeat __________ times. Complete this exercise __________ times per day.  STRENGTHENING  Abdominals and Quadriceps,  Straight Leg Raise   Lie on a firm bed or floor with both legs extended in front of you.  Keeping one leg in contact with the floor, bend the other knee so that your foot can rest flat on the floor.  Find your neutral spine, and tense your abdominal muscles to maintain your spinal position throughout the exercise.  Slowly lift your straight leg off the floor about 6 inches for a count of 15, making sure to not hold your breath.  Still keeping your neutral spine, slowly lower your leg all the way to the floor. Repeat this exercise with each leg __________ times. Complete this exercise __________ times per day. POSTURE AND BODY MECHANICS CONSIDERATIONS - Low Back Sprain Keeping correct posture when sitting, standing or completing your activities will reduce the stress put on different body tissues, allowing injured tissues a chance to heal and limiting painful experiences. The following are general guidelines for improved posture. Your physician or physical therapist will provide you with any instructions specific to your needs. While reading these guidelines, remember:  The exercises prescribed by your provider will help you have the flexibility and strength to maintain correct postures.  The correct posture provides the best environment for your joints to work. All of your joints have less wear and tear when properly supported by a spine with good posture. This means you will experience a healthier, less painful body.  Correct posture must be practiced with all of your activities, especially prolonged sitting and standing. Correct posture is as important when doing repetitive low-stress activities (typing) as it is when doing a single heavy-load activity (lifting). RESTING POSITIONS Consider which positions are most painful for you when choosing a resting position. If you have pain with flexion-based activities (sitting, bending, stooping, squatting), choose a position that allows you to rest in  a less flexed posture. You would want to avoid curling into a fetal position on your side. If your pain worsens with extension-based activities (prolonged standing, working overhead), avoid resting in an extended position such as sleeping on your stomach. Most people will find more comfort when they rest with their spine in a more neutral position, neither too rounded nor too arched. Lying on a non-sagging bed on your side  with a pillow between your knees, or on your back with a pillow under your knees will often provide some relief. Keep in mind, being in any one position for a prolonged period of time, no matter how correct your posture, can still lead to stiffness. PROPER SITTING POSTURE In order to minimize stress and discomfort on your spine, you must sit with correct posture. Sitting with good posture should be effortless for a healthy body. Returning to good posture is a gradual process. Many people can work toward this most comfortably by using various supports until they have the flexibility and strength to maintain this posture on their own. When sitting with proper posture, your ears will fall over your shoulders and your shoulders will fall over your hips. You should use the back of the chair to support your upper back. Your lower back will be in a neutral position, just slightly arched. You may place a small pillow or folded towel at the base of your lower back for  support.  When working at a desk, create an environment that supports good, upright posture. Without extra support, muscles tire, which leads to excessive strain on joints and other tissues. Keep these recommendations in mind: CHAIR:  A chair should be able to slide under your desk when your back makes contact with the back of the chair. This allows you to work closely.  The chair's height should allow your eyes to be level with the upper part of your monitor and your hands to be slightly lower than your elbows. BODY  POSITION  Your feet should make contact with the floor. If this is not possible, use a foot rest.  Keep your ears over your shoulders. This will reduce stress on your neck and low back. INCORRECT SITTING POSTURES  If you are feeling tired and unable to assume a healthy sitting posture, do not slouch or slump. This puts excessive strain on your back tissues, causing more damage and pain. Healthier options include:  Using more support, like a lumbar pillow.  Switching tasks to something that requires you to be upright or walking.  Talking a brief walk.  Lying down to rest in a neutral-spine position. PROLONGED STANDING WHILE SLIGHTLY LEANING FORWARD  When completing a task that requires you to lean forward while standing in one place for a long time, place either foot up on a stationary 2-4 inch high object to help maintain the best posture. When both feet are on the ground, the lower back tends to lose its slight inward curve. If this curve flattens (or becomes too large), then the back and your other joints will experience too much stress, tire more quickly, and can cause pain. CORRECT STANDING POSTURES Proper standing posture should be assumed with all daily activities, even if they only take a few moments, like when brushing your teeth. As in sitting, your ears should fall over your shoulders and your shoulders should fall over your hips. You should keep a slight tension in your abdominal muscles to brace your spine. Your tailbone should point down to the ground, not behind your body, resulting in an over-extended swayback posture.  INCORRECT STANDING POSTURES  Common incorrect standing postures include a forward head, locked knees and/or an excessive swayback. WALKING Walk with an upright posture. Your ears, shoulders and hips should all line-up. PROLONGED ACTIVITY IN A FLEXED POSITION When completing a task that requires you to bend forward at your waist or lean over a low surface, try to  find a way to stabilize 3 out of 4 of your limbs. You can place a hand or elbow on your thigh or rest a knee on the surface you are reaching across. This will provide you more stability, so that your muscles do not tire as quickly. By keeping your knees relaxed, or slightly bent, you will also reduce stress across your lower back. CORRECT LIFTING TECHNIQUES DO :  Assume a wide stance. This will provide you more stability and the opportunity to get as close as possible to the object which you are lifting.  Tense your abdominals to brace your spine. Bend at the knees and hips. Keeping your back locked in a neutral-spine position, lift using your leg muscles. Lift with your legs, keeping your back straight.  Test the weight of unknown objects before attempting to lift them.  Try to keep your elbows locked down at your sides in order get the best strength from your shoulders when carrying an object.  Always ask for help when lifting heavy or awkward objects. INCORRECT LIFTING TECHNIQUES DO NOT:   Lock your knees when lifting, even if it is a small object.  Bend and twist. Pivot at your feet or move your feet when needing to change directions.  Assume that you can safely pick up even a paperclip without proper posture. Document Released: 10/20/2005 Document Revised: 01/12/2012 Document Reviewed: 02/01/2009 [facility] Patient Information 2014 [place], [place].

## 2013-08-23 ENCOUNTER — Telehealth: Payer: Self-pay | Admitting: *Deleted

## 2013-08-23 NOTE — Telephone Encounter (Signed)
Pt calls & wants to know if he can increase his tramadol.  He is still having some back pain. He states that this works better than the vicodin, that he can't function on that. Please advise

## 2013-08-24 ENCOUNTER — Encounter: Payer: Self-pay | Admitting: Physician Assistant

## 2013-08-24 ENCOUNTER — Other Ambulatory Visit: Payer: Self-pay | Admitting: Physician Assistant

## 2013-08-24 MED ORDER — TRAMADOL HCL 50 MG PO TABS: ORAL_TABLET | ORAL | Status: DC

## 2013-08-24 NOTE — Telephone Encounter (Signed)
He can increase to 2 tabs 100mg  of tramadol for as needed back pain. Should not be taking every day. Needs to do exercises and anti-inflammatories.

## 2013-08-24 NOTE — Progress Notes (Signed)
Pt needed refill on tramadol.

## 2013-08-24 NOTE — Telephone Encounter (Signed)
Patient advised.

## 2013-09-09 ENCOUNTER — Ambulatory Visit (INDEPENDENT_AMBULATORY_CARE_PROVIDER_SITE_OTHER): Payer: Self-pay | Admitting: Family Medicine

## 2013-09-09 ENCOUNTER — Encounter: Payer: Self-pay | Admitting: Family Medicine

## 2013-09-09 VITALS — BP 114/63 | HR 80 | Temp 97.9°F | Wt 222.0 lb

## 2013-09-09 DIAGNOSIS — L910 Hypertrophic scar: Secondary | ICD-10-CM

## 2013-09-09 DIAGNOSIS — M545 Low back pain: Secondary | ICD-10-CM

## 2013-09-09 DIAGNOSIS — L219 Seborrheic dermatitis, unspecified: Secondary | ICD-10-CM

## 2013-09-09 MED ORDER — IBUPROFEN 800 MG PO TABS: 800.0000 mg | ORAL_TABLET | Freq: Three times a day (TID) | ORAL | Status: DC

## 2013-09-09 MED ORDER — TRIAMCINOLONE ACETONIDE 0.1 % EX CREA
TOPICAL_CREAM | CUTANEOUS | Status: DC
Start: 2013-09-09 — End: 2013-11-25

## 2013-09-09 MED ORDER — TRAMADOL HCL 50 MG PO TABS: ORAL_TABLET | ORAL | Status: DC

## 2013-09-09 MED ORDER — CLOBETASOL PROPIONATE 0.05 % EX GEL: 1.0000 "application " | Freq: Two times a day (BID) | CUTANEOUS | Status: DC

## 2013-09-09 NOTE — Progress Notes (Signed)
CC: Theodore Walker is a 27 y.o. male is here for Ear Problem   Subjective: HPI:  Complains of growth and warmth of the left ear localized to the top of the ear has been present for the past 2 weeks has not been getting better or worse since onset. Denies pain. He had a piercing to 3 years ago at this location no trauma recently. Last year he was able to express pus from this location however no discharge recently. Denies fevers, chills, hearing loss, ear pain, flushing or rapid heartbeat.  Requesting refills on tramadol and ibuprofen for low back pain is localized in the midline of the low back just above the pelvis similar character and severity compared to back in September.  Pain is nonradiating no bowel or bladder dysfunction no saddle paresthesia no weakness of either extremity. No new motor or sensory disturbances.  Requesting refills on clobetasol for rash that occurs on forehead when he gets sweaty. Currently no rashes present. Rash is described as flakes that responds within days after starting clobetasol localized only to forehead nowhere else on scalp.  Review Of Systems Outlined In HPI  Past Medical History  Diagnosis Date  . GERD (gastroesophageal reflux disease)   . IBS (irritable bowel syndrome)   . Hiatal hernia   . Spastic colon   . Appendicitis   . GERD (gastroesophageal reflux disease)      Family History  Problem Relation Age of Onset  . Hypertension Mother   . Diabetes Mother      History  Substance Use Topics  . Smoking status: Never Smoker   . Smokeless tobacco: Current User    Types: Chew  . Alcohol Use: No     Objective: Filed Vitals:   09/09/13 0855  BP: 114/63  Pulse: 80  Temp: 97.9 F (36.6 C)    General: Alert and Oriented, No Acute Distress HEENT: Pupils equal, round, reactive to light. Conjunctivae clear.  On the left upper ear there a 5 mm keloid on the external and internal surface of the helix which is nontender. Otherwise External  ears unremarkable, canals clear with intact TMs with appropriate landmarks.  Middle ear appears open without effusion. scalp unremarkable skin on face is unremarkable Back: Full range of motion and strength of the lumbar spine no midline spinous process tenderness no paraspinal muscular tenderness Extremities: No peripheral edema.  Strong peripheral pulses.  Full range of motion and strength in both lower extremities Mental Status: No depression, anxiety, nor agitation. Skin: Warm and dry.  Assessment & Plan: Theodore Walker was seen today for ear problem.  Diagnoses and associated orders for this visit:  Low back pain - traMADol (ULTRAM) 50 MG tablet; Take 1-2 tablets every 8 hours for pain. - ibuprofen (ADVIL,MOTRIN) 800 MG tablet; Take 1 tablet (800 mg total) by mouth 3 (three) times daily. Take with food  Seborrheic dermatitis - clobetasol (TEMOVATE) 0.05 % GEL; Apply 1 application topically 2 (two) times daily.  Keloid - triamcinolone cream (KENALOG) 0.1 %; Apply to affected area on ear twice a day for up to four weeks, avoid face.     keloid: Offered steroid injection today into the keloid he politely declines but is interested in trying topical steroid, apply for up to 2-4 weeks return if interested in injection to the lesion. Seborrheic dermatitis: Controlled continue Colazal Low back pain: Controlled again Refills provided until followup with PCP which I have urged him to do in the next 2-4 weeks   Return in  about 4 weeks (around 10/07/2013) for PCP F/U.

## 2013-09-15 ENCOUNTER — Telehealth: Payer: Self-pay | Admitting: *Deleted

## 2013-09-15 DIAGNOSIS — M545 Low back pain: Secondary | ICD-10-CM

## 2013-09-15 NOTE — Telephone Encounter (Signed)
Pt calls stating that he needs a refill on his tramadol.  #60 was written on 11/7.  He states he is taking 2 tabs every 6-8 hrs & the cold weather makes his pain worse.  Sig states q8. This seems really early to me so I wanted to check with you.

## 2013-09-16 ENCOUNTER — Encounter: Payer: Self-pay | Admitting: Emergency Medicine

## 2013-09-16 ENCOUNTER — Emergency Department (INDEPENDENT_AMBULATORY_CARE_PROVIDER_SITE_OTHER)
Admission: EM | Admit: 2013-09-16 | Discharge: 2013-09-16 | Disposition: A | Discharge status: Home / Self Care | Payer: Self-pay | Source: Home / Self Care | Attending: Family Medicine | Admitting: Family Medicine

## 2013-09-16 DIAGNOSIS — J069 Acute upper respiratory infection, unspecified: Secondary | ICD-10-CM

## 2013-09-16 MED ORDER — AZITHROMYCIN 250 MG PO TABS: ORAL_TABLET | ORAL | Status: DC

## 2013-09-16 MED ORDER — BENZONATATE 200 MG PO CAPS: 200.0000 mg | ORAL_CAPSULE | Freq: Every day | ORAL | Status: DC

## 2013-09-16 MED ORDER — TRAMADOL HCL 50 MG PO TABS: ORAL_TABLET | ORAL | Status: DC

## 2013-09-16 NOTE — Telephone Encounter (Signed)
Pt notified & was sent to scheduling to see Dr. Karie Schwalbe. #45 of tramadol written.

## 2013-09-16 NOTE — ED Provider Notes (Signed)
CSN: [id number]     Arrival date & time 09/16/13  4098 History   First MD Initiated Contact with Patient 09/16/13 [phone]     Chief Complaint  Patient presents with  . Cough  . Nasal Congestion  . Sore Throat  . Emesis      HPI Comments: Patient became fatigued two days ago, and yesterday developed a non-productive cough with scratchy throat, sinus congestion, fatigue, and headache.  He occasionally coughs until he gags.  The history is provided by the patient.    Past Medical History  Diagnosis Date  . GERD (gastroesophageal reflux disease)   . IBS (irritable bowel syndrome)   . Hiatal hernia   . Spastic colon   . Appendicitis   . GERD (gastroesophageal reflux disease)    Past Surgical History  Procedure Laterality Date  . Knee surgery    . Nose surgery    . Vasectomy    . Appendectomy     Family History  Problem Relation Age of Onset  . Hypertension Mother   . Diabetes Mother    History  Substance Use Topics  . Smoking status: Never Smoker   . Smokeless tobacco: Current User    Types: Chew  . Alcohol Use: No    Review of Systems + sore throat + cough No pleuritic pain No wheezing + nasal congestion + post-nasal drainage No sinus pain/pressure No itchy/red eyes No earache No hemoptysis No SOB No fever/chills, +sweats No nausea + vomiting, resolved No abdominal pain No diarrhea No urinary symptoms No skin rash + fatigue No myalgias + headache Used OTC meds without relief  Allergies  Morphine and related  Home Medications   Current Outpatient Rx  Name  Route  Sig  Dispense  Refill  . azithromycin (ZITHROMAX Z-PAK) 250 MG tablet      Take 2 tabs today; then begin one tab once daily for 4 more days.   6 each   0   . benzonatate (TESSALON) 200 MG capsule   Oral   Take 1 capsule (200 mg total) by mouth at bedtime. Take as needed for cough   12 capsule   0   . clobetasol (TEMOVATE) 0.05 % GEL   Topical   Apply 1 application topically 2  (two) times daily.   60 each   1   . cyclobenzaprine (FLEXERIL) 10 MG tablet   Oral   Take 1 tablet (10 mg total) by mouth 3 (three) times daily as needed for muscle spasms.   30 tablet   0   . doxycycline (VIBRAMYCIN) 100 MG capsule   Oral   Take 1 capsule (100 mg total) by mouth 2 (two) times daily. For 10 days.   20 capsule   0   . ibuprofen (ADVIL,MOTRIN) 800 MG tablet   Oral   Take 1 tablet (800 mg total) by mouth 3 (three) times daily. Take with food   90 tablet   1   . mupirocin ointment (BACTROBAN) 2 %      Use nasally application three times a day for 4 days.   30 g   0   . traMADol (ULTRAM) 50 MG tablet      Take 1-2 tablets every 8 hours for pain.   60 tablet   0   . triamcinolone cream (KENALOG) 0.1 %      Apply to affected area on ear twice a day for up to four weeks, avoid face.   80 g  0    BP 109/68  Pulse 86  Temp(Src) 98.1 F (36.7 C) (Oral)  Resp 16  Ht 6\' 3"  (1.905 m)  Wt 220 lb (99.791 kg)  BMI 27.50 kg/m2  SpO2 98% Physical Exam Nursing notes and Vital Signs reviewed. Appearance:  Patient appears healthy, stated age, and in no acute distress Eyes:  Pupils are equal, round, and reactive to light and accomodation.  Extraocular movement is intact.  Conjunctivae are not inflamed  Ears:  Canals normal.  Tympanic membranes normal.  Nose:  Mildly congested turbinates.  No sinus tenderness.   Pharynx:  Normal Neck:  Supple.   Tender shotty posterior nodes are palpated bilaterally  Lungs:  Clear to auscultation.  Breath sounds are equal.  Heart:  Regular rate and rhythm without murmurs, rubs, or gallops.  Abdomen:  Nontender without masses or hepatosplenomegaly.  Bowel sounds are present.  No CVA or flank tenderness.  Extremities:  No edema.  No calf tenderness Skin:  No rash present.   ED Course  Procedures  none       MDM   1. Acute upper respiratory infections of unspecified site    Begin Z-pack to cover  atypicals Prescription written for Benzonatate (Tessalon) to take at bedtime for night-time cough.  Take Mucinex D (guaifenesin with decongestant) twice daily for congestion.  Increase fluid intake, rest. May use Afrin nasal spray (or generic oxymetazoline) twice daily for about 5 days.  Also recommend using saline nasal spray several times daily and saline nasal irrigation (AYR is a common brand) Stop all antihistamines for now, and other non-prescription cough/cold preparations. Follow-up with family doctor if not improving about10 days.     Lattie Haw, MD 09/16/13 [phone]

## 2013-09-16 NOTE — ED Notes (Signed)
Reports 24 hours of congestion, cough producing yellow/green sputum and causing rib pain; body aches and vomiting; no known fever. Has not had Flu vaccination this season. No OTCs this a.m.

## 2013-09-16 NOTE — Telephone Encounter (Signed)
Our records show you should still have 30 pills left. We do not continually give pain rx for acute problems. Do you have a orthopedist. If not would you like to schedule with Dr.T in office.

## 2013-09-22 ENCOUNTER — Ambulatory Visit: Payer: Self-pay | Admitting: Sports Medicine

## 2013-09-23 ENCOUNTER — Encounter: Payer: Self-pay | Admitting: Sports Medicine

## 2013-09-23 ENCOUNTER — Ambulatory Visit (INDEPENDENT_AMBULATORY_CARE_PROVIDER_SITE_OTHER): Payer: Self-pay | Admitting: Sports Medicine

## 2013-09-23 ENCOUNTER — Ambulatory Visit: Payer: Self-pay | Admitting: Physician Assistant

## 2013-09-23 VITALS — BP 133/80 | HR 76 | Wt 221.0 lb

## 2013-09-23 DIAGNOSIS — M545 Low back pain, unspecified: Secondary | ICD-10-CM

## 2013-09-23 DIAGNOSIS — M25562 Pain in left knee: Secondary | ICD-10-CM

## 2013-09-23 DIAGNOSIS — M222X2 Patellofemoral disorders, left knee: Secondary | ICD-10-CM | POA: Insufficient documentation

## 2013-09-23 DIAGNOSIS — M25569 Pain in unspecified knee: Secondary | ICD-10-CM

## 2013-09-23 MED ORDER — TRAMADOL HCL 50 MG PO TABS: ORAL_TABLET | ORAL | Status: DC

## 2013-09-23 MED ORDER — MELOXICAM 15 MG PO TABS: ORAL_TABLET | ORAL | Status: DC

## 2013-09-23 NOTE — Assessment & Plan Note (Signed)
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 Refilling.

## 2013-09-23 NOTE — Assessment & Plan Note (Signed)
Mobic daily. Home rehabilitation. Return in a couple of months and we can consider injection.

## 2013-09-23 NOTE — Progress Notes (Signed)
  Subjective:    CC: Knee pain  HPI: Left knee pain: Brix has a history of a patellar dislocation, since this episode years ago he has had significant pain he localizes underneath the kneecap which is worse with going up and down stairs. He also has pain at the medial and lateral joint lines. Pain is moderate, persistent. He does not have insurance and would like to pursue conservative measures first.  Low back pain: Overall improved significantly, much better with occasional tramadol.  Past medical history, Surgical history, Family history not pertinant except as noted below, Social history, Allergies, and medications have been entered into the medical record, reviewed, and no changes needed.   Review of Systems: No fevers, chills, night sweats, weight loss, chest pain, or shortness of breath.   Objective:    General: Well Developed, well nourished, and in no acute distress.  Neuro: Alert and oriented x3, extra-ocular muscles intact, sensation grossly intact.  HEENT: Normocephalic, atraumatic, pupils equal round reactive to light, neck supple, no masses, no lymphadenopathy, thyroid nonpalpable.  Skin: Warm and dry, no rashes. Cardiac: Regular rate and rhythm, no murmurs rubs or gallops, no lower extremity edema.  Respiratory: Clear to auscultation bilaterally. Not using accessory muscles, speaking in full sentences. Left Knee: Only minimal swelling visible. Palpation at the medial joint line, as well as under the medial and lateral patellar facet. All ligamentous structures are stable including ACL, PCL, LCL, MCL. Negative Mcmurray's, Apley's, and Thessalonian tests. Non painful patellar compression. Patellar glide without crepitus. Patellar and quadriceps tendons unremarkable. Hamstring and quadriceps strength is normal.   Impression and Recommendations:

## 2013-10-13 ENCOUNTER — Encounter (HOSPITAL_BASED_OUTPATIENT_CLINIC_OR_DEPARTMENT_OTHER): Payer: Self-pay | Admitting: Emergency Medicine

## 2013-10-13 ENCOUNTER — Emergency Department (HOSPITAL_BASED_OUTPATIENT_CLINIC_OR_DEPARTMENT_OTHER): Payer: Self-pay

## 2013-10-13 ENCOUNTER — Emergency Department (HOSPITAL_BASED_OUTPATIENT_CLINIC_OR_DEPARTMENT_OTHER)
Admission: EM | Admit: 2013-10-13 | Discharge: 2013-10-13 | Disposition: A | Discharge status: Home / Self Care | Payer: Self-pay | Attending: Emergency Medicine | Admitting: Emergency Medicine

## 2013-10-13 DIAGNOSIS — R5381 Other malaise: Secondary | ICD-10-CM | POA: Insufficient documentation

## 2013-10-13 DIAGNOSIS — Z8719 Personal history of other diseases of the digestive system: Secondary | ICD-10-CM | POA: Insufficient documentation

## 2013-10-13 DIAGNOSIS — M25511 Pain in right shoulder: Secondary | ICD-10-CM

## 2013-10-13 DIAGNOSIS — M25519 Pain in unspecified shoulder: Secondary | ICD-10-CM | POA: Insufficient documentation

## 2013-10-13 DIAGNOSIS — Z791 Long term (current) use of non-steroidal anti-inflammatories (NSAID): Secondary | ICD-10-CM | POA: Insufficient documentation

## 2013-10-13 DIAGNOSIS — Z792 Long term (current) use of antibiotics: Secondary | ICD-10-CM | POA: Insufficient documentation

## 2013-10-13 DIAGNOSIS — G8911 Acute pain due to trauma: Secondary | ICD-10-CM | POA: Insufficient documentation

## 2013-10-13 DIAGNOSIS — Z79899 Other long term (current) drug therapy: Secondary | ICD-10-CM | POA: Insufficient documentation

## 2013-10-13 MED ORDER — OXYCODONE-ACETAMINOPHEN 5-325 MG PO TABS
1.0000 | ORAL_TABLET | Freq: Once | ORAL | Status: AC
  Administered 2013-10-13: 1 via ORAL
  Filled 2013-10-13: qty 1

## 2013-10-13 MED ORDER — OXYCODONE-ACETAMINOPHEN 5-325 MG PO TABS: 1.0000 | ORAL_TABLET | Freq: Three times a day (TID) | ORAL | Status: DC | PRN

## 2013-10-13 NOTE — ED Notes (Signed)
MD at bedside, patient preparing for discharge. 

## 2013-10-13 NOTE — ED Notes (Signed)
MD at bedside. 

## 2013-10-13 NOTE — ED Provider Notes (Signed)
Medical screening examination/treatment/procedure(s) were conducted as a shared visit with resident physician and myself.  I personally evaluated the patient during the encounter.  I interviewed and examined the patient. Lungs are CTAB. Cardiac exam wnl. Abdomen soft. Moderately limited rom of right shoulder d/t pain, but no evidence of dislocation on imaging or exam. Will place in sling for comfort. Gave frozen shoulder precautions. F/u w/ sports medicine.    Junius Argyle, MD 10/13/13 [phone]

## 2013-10-13 NOTE — ED Provider Notes (Signed)
CSN: [id number]     Arrival date & time 10/13/13  4098 History   First MD Initiated Contact with Patient 10/13/13 [phone]     Chief Complaint  Patient presents with  . right shoulder pain after dislocating and replacing last pm    (Consider location/radiation/quality/duration/timing/severity/associated sxs/prior Treatment) HPI Comments: [name] is a 27 year old Caucasian male with IBS and GERD presenting today after hurting his right [place]. 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 He subsequently noted extreme pain and his right shoulder to be dislocated anteriorly. The shoulder was reduced back in place by his father in law Theodore Walker) which improved the pain slightly but continues today. He has very limited flexion, extension and rotation of the right arm due to pain but good mobility below his elbow. He denies any other complaints, no bleeding, fever, chills, nausea, or vomiting. He did try 2 tramadol tablets, total 100mg  last night with no relief.   Patient is a 27 y.o. male presenting with shoulder pain. The history is provided by the patient. No language interpreter was used.  Shoulder Pain This is a new problem. The current episode started yesterday. The problem occurs constantly. The problem has been gradually worsening. Associated symptoms include joint swelling and weakness. Pertinent negatives include no abdominal pain, chest pain, chills, congestion, fever or numbness. The symptoms are aggravated by exertion and bending. He has tried rest (tramadol) for the symptoms. The treatment provided no relief.   Past Medical History  Diagnosis Date  . GERD (gastroesophageal reflux disease)   . IBS (irritable bowel syndrome)   . Hiatal hernia   . Spastic colon   . Appendicitis   . GERD (gastroesophageal reflux disease)    Past Surgical History  Procedure Laterality Date  . Knee surgery    . Nose surgery    . Vasectomy     . Appendectomy     Family History  Problem Relation Age of Onset  . Hypertension Mother   . Diabetes Mother    History  Substance Use Topics  . Smoking status: Never Smoker   . Smokeless tobacco: Current User    Types: Chew  . Alcohol Use: No    Review of Systems  Constitutional: Positive for activity change. Negative for fever and chills.  HENT: Negative for congestion.   Respiratory: Negative for chest tightness and shortness of breath.   Cardiovascular: Negative for chest pain.  Gastrointestinal: Negative for abdominal pain.  Musculoskeletal: Positive for joint swelling. Negative for back pain.       R shoulder pain   Neurological: Positive for weakness. Negative for numbness.  Psychiatric/Behavioral: Negative for confusion.    Allergies  Morphine and related  Home Medications   Current Outpatient Rx  Name  Route  Sig  Dispense  Refill  . clobetasol (TEMOVATE) 0.05 % GEL   Topical   Apply 1 application topically 2 (two) times daily.   60 each   1   . cyclobenzaprine (FLEXERIL) 10 MG tablet   Oral   Take 1 tablet (10 mg total) by mouth 3 (three) times daily as needed for muscle spasms.   30 tablet   0   . doxycycline (VIBRAMYCIN) 100 MG capsule   Oral   Take 1 capsule (100 mg total) by mouth 2 (two) times daily. For 10 days.   20 capsule   0   . meloxicam (MOBIC) 15 MG  tablet      One tab PO qAM with breakfast for 2 weeks, then daily prn pain.   90 tablet   3   . mupirocin ointment (BACTROBAN) 2 %      Use nasally application three times a day for 4 days.   30 g   0   . oxyCODONE-acetaminophen (PERCOCET/ROXICET) 5-325 MG per tablet   Oral   Take 1 tablet by mouth every 8 (eight) hours as needed for severe pain.   15 tablet   0   . traMADol (ULTRAM) 50 MG tablet      Take 1-2 tablets every 8 hours for pain.   180 tablet   3   . triamcinolone cream (KENALOG) 0.1 %      Apply to affected area on ear twice a day for up to four weeks,  avoid face.   80 g   0    BP 128/81  Pulse 78  Temp(Src) 98.9 F (37.2 C) (Oral)  Resp 16  Ht 6\' 3"  (1.905 m)  Wt 198 lb (89.812 kg)  BMI 24.75 kg/m2  SpO2 99% Physical Exam  Constitutional: He is oriented to person, place, and time. He appears well-developed and well-nourished. He appears distressed.  HENT:  Head: Normocephalic and atraumatic.  Eyes: EOM are normal. Pupils are equal, round, and reactive to light.  Neck: Normal range of motion.  Cardiovascular: Normal rate, regular rhythm and normal heart sounds.   Pulmonary/Chest: Effort normal and breath sounds normal.  Abdominal: Soft. Bowel sounds are normal. He exhibits no distension.  Musculoskeletal: He exhibits edema and tenderness.  R shoulder edema, tenderness to palpation of right shoulder, limited extension, flexion, and rotation of right shoulder, worse with internal rotation and flexion.   Neurological: He is alert and oriented to person, place, and time. No cranial nerve deficit.  Decreased strength 4/5 RUE compared to 5/5 LLE due to pain  Skin: Skin is warm and dry.  Psychiatric: His behavior is normal. Thought content normal.    ED Course  Procedures (including critical care time) Labs Review Labs Reviewed - No data to display Imaging Review Dg Shoulder Right  10/13/2013   CLINICAL DATA:  Injury. Patient reports dislocating right shoulder last night with subsequent reduction. Right shoulder pain currently.  EXAM: RIGHT SHOULDER - 2+ VIEW  COMPARISON:  08/28/2012  FINDINGS: There is no evidence of acute fracture. Glenohumeral and acromioclavicular joints are located. Joint spaces are maintained. Bone mineralization appears normal. No soft tissue abnormality is seen.  IMPRESSION: No evidence of acute osseous abnormality.   Electronically Signed   By: Theodore Walker   On: 10/13/2013 08:31    EKG Interpretation   None      MDM   1. Right shoulder pain    Theodore Walker is a 27 year old male presenting s/p being  kicked in his right shoulder by cattle yesterday with subsequent dislocation and reduction by father in law with worsening pain and limitation of movement today. Xray of right shoulder with no acute fracture or soft tissue abnormality. Mild edema to area. Supportive measures recommended at this time. Placed in right shoulder sling, ice to area up to 4 times a day, and rest. Follow up with pcp or sports medicine advised.  -percocet 5-325mg  po x1 -will d/c with short course of percoet for pain control. Documented allergy to morphine in chart but patient states it made him itchy when he had knee surgery but he tolerates percocet with no  complaints -advised to return if pain worsens, or swelling, or develops weakness, numbness in arm right away  Patient seen and discussed with Dr. Wilmer Floor, MD 10/13/13 [phone]

## 2013-10-13 NOTE — ED Notes (Signed)
Ice pack given for shoulder. Returned from x ray

## 2013-10-13 NOTE — ED Notes (Signed)
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

## 2013-11-11 ENCOUNTER — Encounter: Payer: Self-pay | Admitting: Physician Assistant

## 2013-11-11 ENCOUNTER — Ambulatory Visit (INDEPENDENT_AMBULATORY_CARE_PROVIDER_SITE_OTHER): Payer: Self-pay | Admitting: Physician Assistant

## 2013-11-11 VITALS — BP 120/68 | HR 85 | Wt 222.0 lb

## 2013-11-11 DIAGNOSIS — B351 Tinea unguium: Secondary | ICD-10-CM

## 2013-11-11 DIAGNOSIS — Z79899 Other long term (current) drug therapy: Secondary | ICD-10-CM

## 2013-11-11 DIAGNOSIS — L600 Ingrowing nail: Secondary | ICD-10-CM

## 2013-11-11 MED ORDER — TERBINAFINE HCL 250 MG PO TABS: 250.0000 mg | ORAL_TABLET | Freq: Every day | ORAL | Status: DC

## 2013-11-11 MED ORDER — HYDROCODONE-ACETAMINOPHEN 5-325 MG PO TABS: 1.0000 | ORAL_TABLET | Freq: Four times a day (QID) | ORAL | Status: DC | PRN

## 2013-11-11 NOTE — Patient Instructions (Signed)
Ingrown Toenail An ingrown toenail occurs when the sharp edge of your toenail grows into the skin. Causes of ingrown toenails include toenails clipped too far back or poorly fitting shoes. Activities involving sudden stops (basketball, tennis) causing "toe jamming" may lead to an ingrown nail. HOME CARE INSTRUCTIONS   Soak the whole foot in warm soapy water for 20 minutes, 3 times per day.  You may lift the edge of the nail away from the sore skin by wedging a small piece of cotton under the corner of the nail. Be careful not to dig (traumatize) and cause more injury to the area.  Wear shoes that fit well. While the ingrown nail is causing problems, sandals may be beneficial.  Trim your toenails regularly and carefully. Cut your toenails straight across, not in a curve. This will prevent injury to the skin at the corners of the toenail.  Keep your feet clean and dry.  Crutches may be helpful early in treatment if walking is painful.  Antibiotics, if prescribed, should be taken as directed.  Return for a wound check in 2 days or as directed.  Only take over-the-counter or prescription medicines for pain, discomfort, or fever as directed by your caregiver. SEEK IMMEDIATE MEDICAL CARE IF:   You have a fever.  You have increasing pain, redness, swelling, or heat at the wound site.  Your toe is not better in 7 days. If conservative treatment is not successful, surgical removal of a portion or all of the nail may be necessary. MAKE SURE YOU:   Understand these instructions.  Will watch your condition.  Will get help right away if you are not doing well or get worse. Document Released: 10/17/2000 Document Revised: 01/12/2012 Document Reviewed: 10/11/2008 [facility] Patient Information 2014 [place], [place].  Onychomycosis/Fungal Toenails  WHAT IS IT? An infection that lies within the keratin of your nail plate that is caused by a fungus.  WHY ME? Fungal infections affect all ages,  sexes, races, and creeds.  There may be many factors that predispose you to a fungal infection such as age, coexisting medical conditions such as diabetes, or an autoimmune disease; stress, medications, fatigue, genetics, etc.  Bottom line: fungus thrives in a warm, moist environment and your shoes offer such a location.  IS IT CONTAGIOUS? Theoretically, yes.  You do not want to share shoes, nail clippers or files with someone who has fungal toenails.  Walking around barefoot in the same room or sleeping in the same bed is unlikely to transfer the organism.  It is important to realize, however, that fungus can spread easily from one nail to the next on the same foot.  HOW DO WE TREAT THIS?  There are several ways to treat this condition.  Treatment may depend on many factors such as age, medications, pregnancy, liver and kidney conditions, etc.  It is best to ask your doctor which options are available to you.  1. No treatment.   Unlike many other medical concerns, you can live with this condition.  However for many people this can be a painful condition and may lead to ingrown toenails or a bacterial infection.  It is recommended that you keep the nails cut short to help reduce the amount of fungal nail. 2. Topical treatment.  These range from herbal remedies to prescription strength nail lacquers.  About 40-50% effective, topicals require twice daily application for approximately 9 to 12 months or until an entirely new nail has grown out.  The most effective  topicals are medical grade medications available through physicians offices. 3. Oral antifungal medications.  With an 80-90% cure rate, the most common oral medication requires 3 to 4 months of therapy and stays in your system for a year as the new nail grows out.  Oral antifungal medications do require blood work to make sure it is a safe drug for you.  A liver function panel will be performed prior to starting the medication and after the first month  of treatment.  It is important to have the blood work performed to avoid any harmful side effects.  In general, this medication safe but blood work is required. 4. Laser Therapy.  This treatment is performed by applying a specialized laser to the affected nail plate.  This therapy is noninvasive, fast, and non-painful.  It is not covered by insurance and is therefore, out of pocket.  The results have been very good with a 80-95% cure rate.  [facility] is the only practice in the area to offer this therapy. 5. Permanent Nail Avulsion.  Removing the entire nail so that a new nail will not grow back.

## 2013-11-11 NOTE — Progress Notes (Signed)
   Subjective:    Patient ID: Theodore Walker, male    DOB: December 09, 1985, 28 y.o.   MRN: [id number]  HPI Patient presents to the clinic with right toe pain. About 6 months ago patient had whole toenail removed since someone at work dropped a heavy object on his toe. Toe nail has grown back but it has been very thick and hard. He tried to clip his toenails last week and ever since he still pain in the medial side of his right great toe. He denies any drainage or signs of infection. He has not had any fever. The medial side of great right toe is red and swollen as well as tender to palpation.   Review of Systems     Objective:   Physical Exam        Assessment & Plan:  Onychomycosis- will treat with Lamisil for 12 weeks. Gave patient CMP to have liver enzymes checked.  Right great toe ingrown- I. did not see any signs of infection. Of toe nail was partially removed today. Patient's toe was wrapped in antibiotic ointment placed on. Patient was given 15 tablets of Vicodin to help with any residual pain. Patient warned of signs of infection. Encouraged patient to soak in Epsom salts 2-3 times a day to help with healing and decrease infection.  Toenail Avulsion Procedure Note  Pre-operative Diagnosis: Right Ingrown Great toenail   Post-operative Diagnosis: Right Ingrown Great toenail  Indications: pain   Anesthesia: Lidocaine 2% without epinephrine without added sodium bicarbonate  Procedure Details  History of allergy to iodine: no  The risks (including bleeding and infection) and benefits of the  procedure and Verbal informed consent obtained.  After digital block anesthesia was obtained, a tourniquet was applied for hemostasis during the procedure.  After prepping with Betadine, the offending edge of the nail was freed from the nailbed and perionychium, and then split with scissors and removed with  forceps.  All visible granulation tissue is debrided. Antibiotic and bulky  dressing was applied.   Findings: Ingrown toenail  Complications: pain.  Plan: 1. Soak the foot twice daily. Change dressing twice daily until healed over. 2. Warning signs of infection were reviewed.   3. Recommended that the patient use Vicodin as needed for pain.

## 2013-11-12 LAB — COMPLETE METABOLIC PANEL WITH GFR
ALK PHOS: 48 U/L (ref 39–117)
ALT: 21 U/L (ref 0–53)
AST: 18 U/L (ref 0–37)
Albumin: 4.7 g/dL (ref 3.5–5.2)
BILIRUBIN TOTAL: 0.3 mg/dL (ref 0.3–1.2)
BUN: 9 mg/dL (ref 6–23)
CO2: 23 mEq/L (ref 19–32)
Calcium: 9.8 mg/dL (ref 8.4–10.5)
Chloride: 106 mEq/L (ref 96–112)
Creat: 0.88 mg/dL (ref 0.50–1.35)
GFR, Est African American: >89 mL/min
GFR, Est Non African American: >89 mL/min
Glucose, Bld: 92 mg/dL (ref 70–99)
Potassium: 4.2 mEq/L (ref 3.5–5.3)
Sodium: 140 mEq/L (ref 135–145)
Total Protein: 7.4 g/dL (ref 6.0–8.3)

## 2013-11-15 ENCOUNTER — Encounter: Payer: Self-pay | Admitting: Physician Assistant

## 2013-11-15 ENCOUNTER — Ambulatory Visit (INDEPENDENT_AMBULATORY_CARE_PROVIDER_SITE_OTHER): Payer: Self-pay | Admitting: Physician Assistant

## 2013-11-15 VITALS — BP 117/75 | HR 102 | Temp 98.0°F | Wt 218.0 lb

## 2013-11-15 DIAGNOSIS — R05 Cough: Secondary | ICD-10-CM

## 2013-11-15 DIAGNOSIS — J069 Acute upper respiratory infection, unspecified: Secondary | ICD-10-CM

## 2013-11-15 DIAGNOSIS — R059 Cough, unspecified: Secondary | ICD-10-CM

## 2013-11-15 MED ORDER — HYDROCODONE-HOMATROPINE 5-1.5 MG/5ML PO SYRP: 5.0000 mL | ORAL_SOLUTION | Freq: Every evening | ORAL | Status: DC | PRN

## 2013-11-15 MED ORDER — BENZONATATE 200 MG PO CAPS: 200.0000 mg | ORAL_CAPSULE | Freq: Three times a day (TID) | ORAL | Status: DC | PRN

## 2013-11-15 NOTE — Progress Notes (Signed)
   Subjective:    Patient ID: Theodore Walker, male    DOB: 11-17-1985, 28 y.o.   MRN: [id number]  HPI Patient presents to the clinic with dry cough for 3 days. He has been using cough drops and does not seem to help. He does so like his chest is tight. 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 He has not had an inhaler in many years. He denies any fever, chills, wheezing or shortness of breath. He has had a mild sore throat and some ear congestion. He denies any sinus pressure. He has had a headache for the last day. He has not tried ibuprofen.   Review of Systems     Objective:   Physical Exam  Constitutional: He is oriented to person, place, and time. He appears well-developed and well-nourished.  HENT:  Head: Normocephalic and atraumatic.  Right Ear: External ear normal.  Left Ear: External ear normal.  Nose: Nose normal.  Mouth/Throat: Oropharynx is clear and moist.  TMs clear bilaterally.  No maxillary or frontal sinus tenderness.  Eyes: Conjunctivae are normal.  Neck: Normal range of motion. Neck supple.  Cardiovascular: Regular rhythm and normal heart sounds.   Tachycardic at 102.  Pulmonary/Chest: Effort normal and breath sounds normal. He has no wheezes.  Dry hacking cough on exam today.  Lymphadenopathy:    He has no cervical adenopathy.  Neurological: He is alert and oriented to person, place, and time.  Skin: Skin is dry.  Psychiatric: He has a normal mood and affect. His behavior is normal.          Assessment & Plan:  URI/cough/chest tightness-peak flows were done since patient reported chest tightness. He registered in the [place] with all three blows. Patient is already on doxycycline for ingrown toenail removal/infection. I suspect he could be getting some upper respiratory symptoms from a virus. Since cough is the major symptom will give him Tessalon Perles for during the day and Hycodan to use at night. Humidifier was encouraged to help open up  congestion as well as Mucinex D 2 times a day. Encouraged ibuprofen for headache. I did offer Toradol shot but patient declined today.

## 2013-11-15 NOTE — Patient Instructions (Signed)
Ibuprofen for headache.   Upper Respiratory Infection, Adult An upper respiratory infection (URI) is also known as the common cold. It is often caused by a type of germ (virus). Colds are easily spread (contagious). You can pass it to others by kissing, coughing, sneezing, or drinking out of the same glass. Usually, you get better in 1 or 2 weeks.  HOME CARE   Only take medicine as told by your doctor.  Use a warm mist humidifier or breathe in steam from a hot shower.  Drink enough water and fluids to keep your pee (urine) clear or pale yellow.  Get plenty of rest.  Return to work when your temperature is back to normal or as told by your doctor. You may use a face mask and wash your hands to stop your cold from spreading. GET HELP RIGHT AWAY IF:   After the first few days, you feel you are getting worse.  You have questions about your medicine.  You have chills, shortness of breath, or brown or red spit (mucus).  You have yellow or brown snot (nasal discharge) or pain in the face, especially when you bend forward.  You have a fever, puffy (swollen) neck, pain when you swallow, or white spots in the back of your throat.  You have a bad headache, ear pain, sinus pain, or chest pain.  You have a high-pitched whistling sound when you breathe in and out (wheezing).  You have a lasting cough or cough up blood.  You have sore muscles or a stiff neck. MAKE SURE YOU:   Understand these instructions.  Will watch your condition.  Will get help right away if you are not doing well or get worse. Document Released: 04/07/2008 Document Revised: 01/12/2012 Document Reviewed: 02/24/2011 [facility] Patient Information 2014 [place], [place]. Cough, Adult  A cough is a reflex that helps clear your throat and airways. It can help heal the body or may be a reaction to an irritated airway. A cough may only last 2 or 3 weeks (acute) or may last more than 8 weeks (chronic).  CAUSES Acute  cough:  Viral or bacterial infections. Chronic cough:  Infections.  Allergies.  Asthma.  Post-nasal drip.  Smoking.  Heartburn or acid reflux.  Some medicines.  Chronic lung problems (COPD).  Cancer. SYMPTOMS   Cough.  Fever.  Chest pain.  Increased breathing rate.  High-pitched whistling sound when breathing (wheezing).  Colored mucus that you cough up (sputum). TREATMENT   A bacterial cough may be treated with antibiotic medicine.  A viral cough must run its course and will not respond to antibiotics.  Your caregiver may recommend other treatments if you have a chronic cough. HOME CARE INSTRUCTIONS   Only take over-the-counter or prescription medicines for pain, discomfort, or fever as directed by your caregiver. Use cough suppressants only as directed by your caregiver.  Use a cold steam vaporizer or humidifier in your bedroom or home to help loosen secretions.  Sleep in a semi-upright position if your cough is worse at night.  Rest as needed.  Stop smoking if you smoke. SEEK IMMEDIATE MEDICAL CARE IF:   You have pus in your sputum.  Your cough starts to worsen.  You cannot control your cough with suppressants and are losing sleep.  You begin coughing up blood.  You have difficulty breathing.  You develop pain which is getting worse or is uncontrolled with medicine.  You have a fever. MAKE SURE YOU:   Understand these instructions.  Will watch your condition.  Will get help right away if you are not doing well or get worse. Document Released: 04/18/2011 Document Revised: 01/12/2012 Document Reviewed: 04/18/2011 [facility] Patient Information 2014 [place].

## 2013-11-25 ENCOUNTER — Encounter: Payer: Self-pay | Admitting: Physician Assistant

## 2013-11-25 ENCOUNTER — Ambulatory Visit (INDEPENDENT_AMBULATORY_CARE_PROVIDER_SITE_OTHER): Payer: Self-pay | Admitting: Physician Assistant

## 2013-11-25 ENCOUNTER — Ambulatory Visit (INDEPENDENT_AMBULATORY_CARE_PROVIDER_SITE_OTHER): Payer: Self-pay

## 2013-11-25 VITALS — BP 124/68 | HR 105 | Wt 216.0 lb

## 2013-11-25 DIAGNOSIS — M545 Low back pain, unspecified: Secondary | ICD-10-CM

## 2013-11-25 DIAGNOSIS — S3992XA Unspecified injury of lower back, initial encounter: Secondary | ICD-10-CM

## 2013-11-25 DIAGNOSIS — IMO0002 Reserved for concepts with insufficient information to code with codable children: Secondary | ICD-10-CM

## 2013-11-25 DIAGNOSIS — M79609 Pain in unspecified limb: Secondary | ICD-10-CM

## 2013-11-25 MED ORDER — CYCLOBENZAPRINE HCL 10 MG PO TABS: 10.0000 mg | ORAL_TABLET | Freq: Three times a day (TID) | ORAL | Status: DC | PRN

## 2013-11-25 MED ORDER — KETOROLAC TROMETHAMINE 60 MG/2ML IM SOLN
60.0000 mg | Freq: Once | INTRAMUSCULAR | Status: AC
  Administered 2013-11-25: 60 mg via INTRAMUSCULAR

## 2013-11-25 MED ORDER — HYDROCODONE-ACETAMINOPHEN 5-325 MG PO TABS: 1.0000 | ORAL_TABLET | Freq: Four times a day (QID) | ORAL | Status: DC | PRN

## 2013-11-25 NOTE — Patient Instructions (Signed)
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
## 2013-11-25 NOTE — Addendum Note (Signed)
Addended by: Donne AnonBENDER, Kenry Daubert L on: 11/25/2013 01:04 PM   Modules accepted: Orders

## 2013-12-01 ENCOUNTER — Encounter: Payer: Self-pay | Admitting: Family Medicine

## 2013-12-01 ENCOUNTER — Ambulatory Visit (INDEPENDENT_AMBULATORY_CARE_PROVIDER_SITE_OTHER): Payer: Self-pay | Admitting: Family Medicine

## 2013-12-01 VITALS — BP 122/81 | HR 76 | Wt 216.0 lb

## 2013-12-01 DIAGNOSIS — M549 Dorsalgia, unspecified: Secondary | ICD-10-CM

## 2013-12-01 MED ORDER — PREDNISONE 20 MG PO TABS: ORAL_TABLET | ORAL | Status: AC

## 2013-12-01 MED ORDER — KETOROLAC TROMETHAMINE 60 MG/2ML IM SOLN
60.0000 mg | Freq: Once | INTRAMUSCULAR | Status: AC
  Administered 2013-12-01: 60 mg via INTRAMUSCULAR
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
## 2013-12-06 ENCOUNTER — Other Ambulatory Visit: Payer: Self-pay | Admitting: *Deleted

## 2013-12-06 ENCOUNTER — Telehealth: Payer: Self-pay | Admitting: *Deleted

## 2013-12-06 DIAGNOSIS — M545 Low back pain, unspecified: Secondary | ICD-10-CM

## 2013-12-06 MED ORDER — TRAMADOL HCL 50 MG PO TABS: ORAL_TABLET | ORAL | Status: DC

## 2013-12-06 NOTE — Telephone Encounter (Signed)
Dr. Benjamin Stainhekkekandam per Joice LoftsAmber is going to fill this med for #120 taking 1 tab every 8 hours.  Notified Amber that [employer] denied this refill. Barry DienesKimberly Gordon, LPN

## 2013-12-06 NOTE — Telephone Encounter (Signed)
Jade notified that Dr. Benjamin Stainhekkekandam refilled tramadol #120 1-2 tabs q8 prn. Pharm called & states that he had 1 refill left from on old rx. I authorized the refill & had pharm disregard the rx from today. Per andrea, pt had also called her phone asking for refill. Pulled pt up in the controlled substance registry & it looks as though he's getting meds from different pharmacies & a couple different doctors.  Jade aware of everything & states that we will not give him any more pains meds without further work-up from Dr. Karie Schwalbe for his neck pain.

## 2013-12-06 NOTE — Telephone Encounter (Signed)
Do not fill. Pt should have tramadol left over since he has had vicodin and other pain rx for acute pain. Need to do more work up do justify more pain rx.

## 2013-12-06 NOTE — Telephone Encounter (Signed)
Pt should have enough for 1 week. That is what it would equal if taking as directed.

## 2013-12-06 NOTE — Telephone Encounter (Signed)
Pharmacy calls and states that this patient is requesting an early refill for the Tramadol.  Last got on 11/18/2013.  Told pharmacy he is going out of town for 3 weeks and needs this.  Do you want to fill this early?

## 2013-12-22 ENCOUNTER — Emergency Department (HOSPITAL_BASED_OUTPATIENT_CLINIC_OR_DEPARTMENT_OTHER): Payer: Self-pay

## 2013-12-22 ENCOUNTER — Emergency Department (HOSPITAL_BASED_OUTPATIENT_CLINIC_OR_DEPARTMENT_OTHER)
Admission: EM | Admit: 2013-12-22 | Discharge: 2013-12-22 | Disposition: A | Discharge status: Home / Self Care | Payer: Self-pay | Attending: Emergency Medicine | Admitting: Emergency Medicine

## 2013-12-22 ENCOUNTER — Encounter (HOSPITAL_BASED_OUTPATIENT_CLINIC_OR_DEPARTMENT_OTHER): Payer: Self-pay | Admitting: Emergency Medicine

## 2013-12-22 DIAGNOSIS — K219 Gastro-esophageal reflux disease without esophagitis: Secondary | ICD-10-CM | POA: Insufficient documentation

## 2013-12-22 DIAGNOSIS — Z9889 Other specified postprocedural states: Secondary | ICD-10-CM | POA: Insufficient documentation

## 2013-12-22 DIAGNOSIS — W010XXA Fall on same level from slipping, tripping and stumbling without subsequent striking against object, initial encounter: Secondary | ICD-10-CM | POA: Insufficient documentation

## 2013-12-22 DIAGNOSIS — Y939 Activity, unspecified: Secondary | ICD-10-CM | POA: Insufficient documentation

## 2013-12-22 DIAGNOSIS — M25562 Pain in left knee: Secondary | ICD-10-CM

## 2013-12-22 DIAGNOSIS — S8990XA Unspecified injury of unspecified lower leg, initial encounter: Secondary | ICD-10-CM | POA: Insufficient documentation

## 2013-12-22 DIAGNOSIS — S99929A Unspecified injury of unspecified foot, initial encounter: Principal | ICD-10-CM

## 2013-12-22 DIAGNOSIS — Y929 Unspecified place or not applicable: Secondary | ICD-10-CM | POA: Insufficient documentation

## 2013-12-22 DIAGNOSIS — S99919A Unspecified injury of unspecified ankle, initial encounter: Principal | ICD-10-CM

## 2013-12-22 MED ORDER — OXYCODONE-ACETAMINOPHEN 5-325 MG PO TABS: 1.0000 | ORAL_TABLET | ORAL | Status: DC | PRN

## 2013-12-22 MED ORDER — OXYCODONE-ACETAMINOPHEN 5-325 MG PO TABS
2.0000 | ORAL_TABLET | Freq: Once | ORAL | Status: AC
  Administered 2013-12-22: 2 via ORAL
  Filled 2013-12-22: qty 2

## 2013-12-22 NOTE — ED Provider Notes (Signed)
CSN: [id number]     Arrival date & time 12/22/13  1425 History   First MD Initiated Contact with Patient 12/22/13 1451     Chief Complaint  Patient presents with  . Knee Pain     (Consider location/radiation/quality/duration/timing/severity/associated sxs/prior Treatment) Patient is a 28 y.o. male presenting with knee pain. The history is provided by the patient. No language interpreter was used.  Knee Pain Location:  Knee Time since incident:  2 hours Injury: yes   Mechanism of injury comment:  Slipped on ice PTA; "my body went one way and my knee went the other" Knee location:  L knee Pain details:    Quality:  Sharp and burning   Radiates to:  Does not radiate   Severity:  Moderate   Onset quality:  Sudden   Timing:  Constant   Progression:  Unchanged Chronicity:  New Prior injury to area: Hx of knee surgery. Relieved by:  Elevation and rest Worsened by:  Bearing weight Ineffective treatments:  None tried Associated symptoms: no back pain, no decreased ROM, no fever, no muscle weakness, no numbness, no swelling and no tingling     Past Medical History  Diagnosis Date  . GERD (gastroesophageal reflux disease)   . IBS (irritable bowel syndrome)   . Hiatal hernia   . Spastic colon   . Appendicitis   . GERD (gastroesophageal reflux disease)    Past Surgical History  Procedure Laterality Date  . Knee surgery    . Nose surgery    . Vasectomy    . Appendectomy     Family History  Problem Relation Age of Onset  . Hypertension Mother   . Diabetes Mother    History  Substance Use Topics  . Smoking status: Never Smoker   . Smokeless tobacco: Current User    Types: Chew  . Alcohol Use: No    Review of Systems  Constitutional: Negative for fever.  Musculoskeletal: Positive for arthralgias. Negative for back pain, joint swelling and myalgias.  Skin: Negative for pallor.  Neurological: Negative for weakness and numbness.  All other systems reviewed and are  negative.      Allergies  Morphine and related  Home Medications   Current Outpatient Rx  Name  Route  Sig  Dispense  Refill  . Lansoprazole (PREVACID PO)   Oral   Take by mouth.         . cyclobenzaprine (FLEXERIL) 10 MG tablet   Oral   Take 1 tablet (10 mg total) by mouth 3 (three) times daily as needed for muscle spasms.   30 tablet   0   . oxyCODONE-acetaminophen (PERCOCET/ROXICET) 5-325 MG per tablet   Oral   Take 1 tablet by mouth every 4 (four) hours as needed for severe pain.   11 tablet   0   . terbinafine (LAMISIL) 250 MG tablet   Oral   Take 1 tablet (250 mg total) by mouth daily. For 3 months.   30 tablet   2   . traMADol (ULTRAM) 50 MG tablet      Take 1-2 tablets every 8 hours for pain.   120 tablet   0    BP 134/72  Pulse 94  Temp(Src) 98.4 F (36.9 C) (Oral)  Resp 16  Ht 6\' 3"  (1.905 m)  Wt 219 lb (99.338 kg)  BMI 27.37 kg/m2  SpO2 100%  Physical Exam  Nursing note and vitals reviewed. Constitutional: He is oriented to person, place, and time.  He appears well-developed and well-nourished. No distress.  HENT:  Head: Normocephalic and atraumatic.  Eyes: Conjunctivae and EOM are normal. No scleral icterus.  Neck: Normal range of motion.  Cardiovascular: Normal rate, regular rhythm and intact distal pulses.   DP and PT pulses 2+ bilaterally.  Pulmonary/Chest: Effort normal. No respiratory distress.  Musculoskeletal: Normal range of motion.       Left knee: He exhibits bony tenderness. He exhibits normal range of motion, no swelling, no effusion, no deformity, no erythema, no LCL laxity, normal patellar mobility and no MCL laxity. Tenderness found. Medial joint line and lateral joint line tenderness noted.       Left upper leg: Normal.       Left lower leg: Normal.  Neurological: He is alert and oriented to person, place, and time. He has normal reflexes. He exhibits normal muscle tone.  Sensation to light touch intact. Patient moves  extremities without ataxia. Patellar and Achilles reflexes 2+ bilaterally.  Skin: Skin is warm and dry. No rash noted. He is not diaphoretic. No erythema. No pallor.  Psychiatric: He has a normal mood and affect. His behavior is normal.    ED Course  Procedures (including critical care time) Labs Review Labs Reviewed - No data to display Imaging Review Dg Knee Complete 4 Views Left  12/22/2013   CLINICAL DATA:  Pain post trauma  EXAM: LEFT KNEE - COMPLETE 4+ VIEW  COMPARISON:  June 11, 2013  FINDINGS: Frontal, lateral, and bilateral oblique views were obtained. There is no fracture, dislocation or effusion. Joint spaces appear intact. A benign appearing lesion in the distal femur with sclerotic periphery and slightly lucent center is stable.  IMPRESSION: Stable benign appearing lesion distal femur. No fracture or effusion.   Electronically Signed   By: Bretta BangWilliam  Woodruff M.D.   On: 12/22/2013 14:50    EKG Interpretation   None       MDM   Final diagnoses:  Knee pain, left   Uncomplicated left knee pain secondary to a mechanical slip on the ice prior to arrival. Patient neurovascularly intact on physical exam. No gross sensory deficits appreciated. Reflexes normal and symmetric. Patient has no crepitus, effusion, or deformity. No laxity or malalignment. No evidence of septic joint. X-ray negative for fracture, dislocation, or bony deformity.  Patient treated in the ED with Percocet. He is stable for discharge with instruction to followup with his orthopedist. Ibuprofen and RICE advised for symptoms as well as bracing. Patient offered crutches in ED which he declined. Return precautions provided and patient agreeable to plan with no unaddressed concerns.    Antony MaduraKelly Frenchie Dangerfield, PA-C 12/22/13 [phone]

## 2013-12-22 NOTE — ED Notes (Signed)
PA at bedside.

## 2013-12-22 NOTE — ED Notes (Signed)
Slipped on ice- left knee "wiggled on me"-pain to posterior knee

## 2013-12-22 NOTE — Discharge Instructions (Signed)
Recommend 600 mg ibuprofen every 6 hours as needed for pain. He may take Percocet as prescribed for breakthrough pain control. Followup with your orthopedist as soon as you're able. Also recommend bracing, ice to the affected area as described below, elevation, and rest. Return if symptoms worsen.   RICE: Routine Care for Injuries The routine care of many injuries includes Rest, Ice, Compression, and Elevation (RICE). HOME CARE INSTRUCTIONS  Rest is needed to allow your body to heal. Routine activities can usually be resumed when comfortable. Injured tendons and bones can take up to 6 weeks to heal. Tendons are the cord-like structures that attach muscle to bone.  Ice following an injury helps keep the swelling down and reduces pain.  Put ice in a plastic bag.  Place a towel between your skin and the bag.  Leave the ice on for 15-20 minutes, 03-04 times a day. Do this while awake, for the first 24 to 48 hours. After that, continue as directed by your caregiver.  Compression helps keep swelling down. It also gives support and helps with discomfort. If an elastic bandage has been applied, it should be removed and reapplied every 3 to 4 hours. It should not be applied tightly, but firmly enough to keep swelling down. Watch fingers or toes for swelling, bluish discoloration, coldness, numbness, or excessive pain. If any of these problems occur, remove the bandage and reapply loosely. Contact your caregiver if these problems continue.  Elevation helps reduce swelling and decreases pain. With extremities, such as the arms, hands, legs, and feet, the injured area should be placed near or above the level of the heart, if possible. SEEK IMMEDIATE MEDICAL CARE IF:  You have persistent pain and swelling.  You develop redness, numbness, or unexpected weakness.  Your symptoms are getting worse rather than improving after several days. These symptoms may indicate that further evaluation or further X-rays  are needed. Sometimes, X-rays may not show a small broken bone (fracture) until 1 week or 10 days later. Make a follow-up appointment with your caregiver. Ask when your X-ray results will be ready. Make sure you get your X-ray results. Document Released: 02/01/2001 Document Revised: 01/12/2012 Document Reviewed: 03/21/2011 [facility] Patient Information 2014 [place], [place].

## 2013-12-22 NOTE — ED Provider Notes (Signed)
Medical screening examination/treatment/procedure(s) were performed by non-physician practitioner and as supervising physician I was immediately available for consultation/collaboration.     Geoffery Lyonsouglas Iszabella Hebenstreit, MD 12/22/13 [phone]

## 2013-12-30 ENCOUNTER — Emergency Department
Admission: EM | Admit: 2013-12-30 | Discharge: 2013-12-30 | Disposition: A | Discharge status: Home / Self Care | Payer: Self-pay | Source: Home / Self Care | Attending: Emergency Medicine | Admitting: Emergency Medicine

## 2013-12-30 ENCOUNTER — Encounter: Payer: Self-pay | Admitting: Emergency Medicine

## 2013-12-30 ENCOUNTER — Other Ambulatory Visit: Payer: Self-pay | Admitting: Emergency Medicine

## 2013-12-30 DIAGNOSIS — L03031 Cellulitis of right toe: Secondary | ICD-10-CM

## 2013-12-30 DIAGNOSIS — L600 Ingrowing nail: Secondary | ICD-10-CM

## 2013-12-30 DIAGNOSIS — L02619 Cutaneous abscess of unspecified foot: Secondary | ICD-10-CM

## 2013-12-30 DIAGNOSIS — L03039 Cellulitis of unspecified toe: Secondary | ICD-10-CM

## 2013-12-30 MED ORDER — CEPHALEXIN 500 MG PO CAPS: 500.0000 mg | ORAL_CAPSULE | Freq: Three times a day (TID) | ORAL | Status: DC

## 2013-12-30 MED ORDER — HYDROCODONE-ACETAMINOPHEN 5-325 MG PO TABS: 1.0000 | ORAL_TABLET | ORAL | Status: DC | PRN

## 2013-12-30 NOTE — ED Provider Notes (Signed)
CSN: [id number]     Arrival date & time 12/30/13  1632 History   First MD Initiated Contact with Patient 12/30/13 1636     Chief Complaint  Patient presents with  . Nail Problem    The history is provided by the patient.   complains of worsening dull and sharp right great toe nail pain intermittently for 2 years, but much worse for past month. 5/10 pain intensity. Worse with palpation and walking. Denies any recent toe injury, but he recalls that this all started about 1 year ago when he sustained a severe contusion and subungual hematoma right great toenail, and the toenail was removed by another physician, but the toenail has since grown back thick, irregular. He saw his PCP Lesly Rubenstein([facility]) on 11/12/13, who performed partial toenail avulsion of the right medial great toenail. He feels that provided some relief temporarily, but the entire toenail has been painful past several weeks, worsening, especially lateral aspect.  His PCP started him on Lamisil 250 mg daily on 11/12/2013. (about 7 weeks ago). He does not feel that has helped, although I explained to him that sometimes it takes 12 weeks of treatment with Lamisil to successfully treat fungal toenail.  He's tried to clip the toenail, which is thickened and deformed. That was not successful. Complains of mild swelling and redness in the soft tissue of right great toe, no definite drainage or bleeding. No fever or chills. Denies bony pain.  Today, he presents to urgent care on Friday afternoon, specifically requesting that I excise the entire right great toenail.   Past Medical History  Diagnosis Date  . GERD (gastroesophageal reflux disease)   . IBS (irritable bowel syndrome)   . Hiatal hernia   . Spastic colon   . Appendicitis   . GERD (gastroesophageal reflux disease)    Past Surgical History  Procedure Laterality Date  . Knee surgery    . Nose surgery    . Vasectomy    . Appendectomy     Family History  Problem Relation Age of Onset    . Hypertension Mother   . Diabetes Mother    History  Substance Use Topics  . Smoking status: Never Smoker   . Smokeless tobacco: Current User    Types: Chew  . Alcohol Use: No    Review of Systems  All other systems reviewed and are negative.    Allergies  Morphine and related I questioned him about this, and he states he has taken hydrocodone and oxycodone in the past without any side effects or reaction Home Medications   Current Outpatient Rx  Name  Route  Sig  Dispense  Refill  . cephALEXin (KEFLEX) 500 MG capsule   Oral   Take 1 capsule (500 mg total) by mouth 3 (three) times daily.   21 capsule   0   . cyclobenzaprine (FLEXERIL) 10 MG tablet   Oral   Take 1 tablet (10 mg total) by mouth 3 (three) times daily as needed for muscle spasms.   30 tablet   0   . HYDROcodone-acetaminophen (NORCO/VICODIN) 5-325 MG per tablet   Oral   Take 1-2 tablets by mouth every 4 (four) hours as needed for severe pain. Take with food.   15 tablet   0   . Lansoprazole (PREVACID PO)   Oral   Take by mouth.         . oxyCODONE-acetaminophen (PERCOCET/ROXICET) 5-325 MG per tablet   Oral   Take 1 tablet by  mouth every 4 (four) hours as needed for severe pain.   11 tablet   0   . terbinafine (LAMISIL) 250 MG tablet   Oral   Take 1 tablet (250 mg total) by mouth daily. For 3 months.   30 tablet   2   . traMADol (ULTRAM) 50 MG tablet      Take 1-2 tablets every 8 hours for pain.   120 tablet   0    BP 128/78  Pulse 59  Resp 14  Wt 227 lb (102.967 kg)  SpO2 99% Physical Exam  Nursing note and vitals reviewed. Constitutional: He is oriented to person, place, and time. He appears well-developed and well-nourished. No distress.  HENT:  Head: Normocephalic and atraumatic.  Eyes: Conjunctivae and EOM are normal. Pupils are equal, round, and reactive to light. No scleral icterus.  Neck: Normal range of motion.  Cardiovascular: Normal rate.   Pulmonary/Chest: Effort  normal.  Abdominal: He exhibits no distension.  Musculoskeletal: Normal range of motion.       Right foot: He exhibits tenderness and swelling. He exhibits normal range of motion, no bony tenderness, normal capillary refill, no deformity and no laceration.       Feet:  Severe ingrown toenail medially and laterally with soft tissue swelling, redness, induration, and exquisite tenderness, of the right great toe. Neurovascular distally intact. No fluctuance. No bony tenderness .  The right great toenail is thickened and slightly irregular.  No subungual hematoma  Neurological: He is alert and oriented to person, place, and time.  Skin: Skin is warm.  Psychiatric: He has a normal mood and affect.    ED Course  Excise ingrown toenail Date/Time: 12/30/2013 5:38 PM Performed by: Georgina Pillion, DAVID Authorized by: Georgina Pillion, DAVID Consent: Verbal consent obtained. Risks and benefits: risks, benefits and alternatives were discussed Consent given by: patient Patient understanding: patient states understanding of the procedure being performed Patient identity confirmed: verbally with patient Time out: Immediately prior to procedure a "time out" was called to verify the correct patient, procedure, equipment, support staff and site/side marked as required. Preparation: Patient was prepped and draped in the usual sterile fashion. Local anesthesia used: yes Anesthesia: digital block Local anesthetic: lidocaine 2% without epinephrine Anesthetic total: 8 ml Patient sedated: no Patient tolerance: Patient tolerated the procedure well with no immediate complications. Comments: Using nail separator and curved hemostat, the entire right great toenail was excised in one piece.--- Toenail sent to lab for fungal culture. Vaseline impregnated gauze dressing.--Wound carefully wrapped. Wound care discussed. Start soaking twice a day in 24-48 hours.   Labs Review Labs Reviewed  FUNGUS CULTURE W SMEAR    Imaging Review No results found.   MDM   1. Ingrown right greater toenail   2. Subungual cellulitis of toe of right foot    Discussed diagnosis and treatment options at length. Risks, benefits, alternatives discussed. Discussed the option of excising the right great toenail and then applying phenol for permanent removal, but he declined that option. He prefers and consents to excising the right great toenail without phenol application . Again, explained risks at length. Explained risks of possibility that the toenail might not grow back. Other risks discussed, including irregular growth of the toenail when it grows back. Discussed other risks, such as infection, pain, and other risks . Therefore, procedure performed to remove the entire right great toenail, but no destruction of the nail bed, and no application of phenol.  See details above. The nail sent  to lab for fungal culture.  Prescribed cephalexin 500 3 times a day for 7 days Ibuprofen 600 mg when necessary mild to moderate pain Vicodin. #15. No refills. One or 2 every 4-6 hours when necessary severe pain postop. Followup with PCP within 1 week for wound recheck the He understands that the fungal culture may take months to get results back, but in the meantime I would still recommend finishing course of Lamisil as prescribed by PCP, with appropriate followup for LFTs.  Precautions discussed. Red flags discussed. Questions invited and answered. Patient voiced understanding and agreement.     Lajean Manes, MD 12/30/13 [phone]

## 2013-12-30 NOTE — ED Notes (Signed)
Pt c/o right great toenail pain and left 2nd toe nail pain. No s/s of infection.

## 2014-01-05 ENCOUNTER — Ambulatory Visit (INDEPENDENT_AMBULATORY_CARE_PROVIDER_SITE_OTHER): Payer: Self-pay | Admitting: Family Medicine

## 2014-01-05 ENCOUNTER — Encounter: Payer: Self-pay | Admitting: Family Medicine

## 2014-01-05 VITALS — BP 125/66 | HR 75 | Temp 98.0°F | Wt 226.0 lb

## 2014-01-05 DIAGNOSIS — L03031 Cellulitis of right toe: Secondary | ICD-10-CM

## 2014-01-05 DIAGNOSIS — L02619 Cutaneous abscess of unspecified foot: Secondary | ICD-10-CM

## 2014-01-05 DIAGNOSIS — L03039 Cellulitis of unspecified toe: Secondary | ICD-10-CM

## 2014-01-05 MED ORDER — OXYCODONE-ACETAMINOPHEN 5-325 MG PO TABS: 1.0000 | ORAL_TABLET | ORAL | Status: DC | PRN

## 2014-01-05 MED ORDER — SULFAMETHOXAZOLE-TMP DS 800-160 MG PO TABS: 2.0000 | ORAL_TABLET | Freq: Two times a day (BID) | ORAL | Status: DC

## 2014-01-05 NOTE — Progress Notes (Signed)
   Subjective:    Patient ID: Theodore Walker, male    DOB: 1986/06/15, 28 y.o.   MRN: [id number]  HPI Infected toe.  Just had nail removed for ingrown nail 7 days ago. Just completed 7 days of keflex.  Has been using neosporin on it. He says it's very painful to touch. Feels warm. He is able to move the joint. He was says its painful especially wearing his boots for work. He has noticed some pus and drainage coming out of it the last 2 days.  Reviewed notes from urgent care   Review of Systems     Objective:   Physical Exam  Constitutional: He appears well-developed and well-nourished.  HENT:  Head: Normocephalic and atraumatic.  Musculoskeletal:       Feet:  Erythema at base.  Some puss expressed along the medial and lateral border.   Skin: Skin is warm and dry.  Psychiatric: He has a normal mood and affect. His behavior is normal.          Assessment & Plan:  Cellulitis of the right great toe - wound culture. Will start Bactrim 2 tabs twice a day to cover for MRSA. Discontinued using topical Neosporin. Recommend Epsom salts soaks for 5-10 minutes twice a day. Had dry. Apply plain Vaseline. Can either keep uncovered or apply a small amount of gauze. Followup in 4 days for wound check. I think at this point he needs to be written out of work. 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 Will give small quantity of percocet for pain control. Work note provided.

## 2014-01-08 LAB — WOUND CULTURE
GRAM STAIN: NONE SEEN
GRAM STAIN: NONE SEEN
Gram Stain: NONE SEEN
Organism ID, Bacteria: NO GROWTH

## 2014-01-10 ENCOUNTER — Telehealth: Payer: Self-pay | Admitting: *Deleted

## 2014-01-10 ENCOUNTER — Other Ambulatory Visit: Payer: Self-pay | Admitting: Sports Medicine

## 2014-01-10 NOTE — Telephone Encounter (Signed)
Ok for refill? 

## 2014-01-10 NOTE — Telephone Encounter (Signed)
Patient calls and request a refill on the Tramadol sent to his pharmacy. Barry DienesKimberly Gordon, LPN

## 2014-01-11 ENCOUNTER — Other Ambulatory Visit: Payer: Self-pay | Admitting: *Deleted

## 2014-01-11 ENCOUNTER — Other Ambulatory Visit: Payer: Self-pay | Admitting: Sports Medicine

## 2014-01-11 MED ORDER — TRAMADOL HCL 50 MG PO TABS: ORAL_TABLET | ORAL | Status: DC

## 2014-01-12 ENCOUNTER — Other Ambulatory Visit: Payer: Self-pay

## 2014-01-21 ENCOUNTER — Emergency Department (HOSPITAL_BASED_OUTPATIENT_CLINIC_OR_DEPARTMENT_OTHER)
Admission: EM | Admit: 2014-01-21 | Discharge: 2014-01-21 | Disposition: A | Discharge status: Home / Self Care | Payer: Self-pay | Attending: Emergency Medicine | Admitting: Emergency Medicine

## 2014-01-21 ENCOUNTER — Encounter (HOSPITAL_BASED_OUTPATIENT_CLINIC_OR_DEPARTMENT_OTHER): Payer: Self-pay | Admitting: Emergency Medicine

## 2014-01-21 DIAGNOSIS — X500XXA Overexertion from strenuous movement or load, initial encounter: Secondary | ICD-10-CM | POA: Insufficient documentation

## 2014-01-21 DIAGNOSIS — Y9389 Activity, other specified: Secondary | ICD-10-CM | POA: Insufficient documentation

## 2014-01-21 DIAGNOSIS — Z8739 Personal history of other diseases of the musculoskeletal system and connective tissue: Secondary | ICD-10-CM | POA: Insufficient documentation

## 2014-01-21 DIAGNOSIS — Z79899 Other long term (current) drug therapy: Secondary | ICD-10-CM | POA: Insufficient documentation

## 2014-01-21 DIAGNOSIS — Y929 Unspecified place or not applicable: Secondary | ICD-10-CM | POA: Insufficient documentation

## 2014-01-21 DIAGNOSIS — K219 Gastro-esophageal reflux disease without esophagitis: Secondary | ICD-10-CM | POA: Insufficient documentation

## 2014-01-21 DIAGNOSIS — M545 Low back pain, unspecified: Secondary | ICD-10-CM

## 2014-01-21 DIAGNOSIS — IMO0002 Reserved for concepts with insufficient information to code with codable children: Secondary | ICD-10-CM | POA: Insufficient documentation

## 2014-01-21 DIAGNOSIS — Z9889 Other specified postprocedural states: Secondary | ICD-10-CM | POA: Insufficient documentation

## 2014-01-21 MED ORDER — CYCLOBENZAPRINE HCL 10 MG PO TABS: 10.0000 mg | ORAL_TABLET | Freq: Two times a day (BID) | ORAL | Status: DC | PRN

## 2014-01-21 MED ORDER — MELOXICAM 7.5 MG PO TABS: 7.5000 mg | ORAL_TABLET | Freq: Every day | ORAL | Status: DC

## 2014-01-21 MED ORDER — OXYCODONE-ACETAMINOPHEN 5-325 MG PO TABS: 1.0000 | ORAL_TABLET | Freq: Four times a day (QID) | ORAL | Status: DC | PRN

## 2014-01-21 NOTE — ED Notes (Signed)
Patient here with ongoing lower back pain x 2 weeks. Reports that the pain started after lifting propane tanks out of truck. Pain with any change in position, no relief with ibuprofen

## 2014-01-21 NOTE — Discharge Instructions (Signed)
Take the medication as directed. Do not take the muscle relaxant or the narcotic if you are driving or working because they will make you sleepy. Follow up with the orthopedic doctor that follows you for your back pain.

## 2014-01-21 NOTE — ED Provider Notes (Signed)
Medical screening examination/treatment/procedure(s) were performed by non-physician practitioner and as supervising physician I was immediately available for consultation/collaboration.   EKG Interpretation None        William Trenia Tennyson, MD 01/21/14 1530 

## 2014-01-21 NOTE — ED Provider Notes (Signed)
CSN: [id number]     Arrival date & time 01/21/14  1114 History   First MD Initiated Contact with Patient 01/21/14 1225     Chief Complaint  Patient presents with  . Back Pain     (Consider location/radiation/quality/duration/timing/severity/associated sxs/prior Treatment) Patient is a 28 y.o. male presenting with back pain. The history is provided by the patient.  Back Pain Location:  Lumbar spine Radiates to:  Does not radiate Pain severity:  Severe Pain is:  Same all the time Onset quality:  Sudden Duration:  2 weeks Timing:  Constant Progression:  Worsening Chronicity:  New Context: physical stress   Relieved by:  Nothing Worsened by:  Bending and standing Ineffective treatments:  Ibuprofen and lying down Associated symptoms: no abdominal pain, no bladder incontinence, no bowel incontinence, no chest pain, no dysuria, no fever, no headaches, no leg pain, no numbness, no tingling and no weakness    Theodore Walker is a 28 y.o. male who presents to the ED with low back pain that started 2 weeks ago after lifting propane tanks out of a truck. PMH significant for herniated disc 8 months ago. He has not had surgery for the disc. He has had knee surgery. Followed by ortho for the disc.   Past Medical History  Diagnosis Date  . GERD (gastroesophageal reflux disease)   . IBS (irritable bowel syndrome)   . Hiatal hernia   . Spastic colon   . Appendicitis   . GERD (gastroesophageal reflux disease)    Past Surgical History  Procedure Laterality Date  . Knee surgery    . Nose surgery    . Vasectomy    . Appendectomy     Family History  Problem Relation Age of Onset  . Hypertension Mother   . Diabetes Mother    History  Substance Use Topics  . Smoking status: Never Smoker   . Smokeless tobacco: Current User    Types: Chew  . Alcohol Use: No    Review of Systems  Constitutional: Negative for fever and chills.  HENT: Negative.   Eyes: Negative for visual  disturbance.  Respiratory: Negative for shortness of breath.   Cardiovascular: Negative for chest pain.  Gastrointestinal: Negative for nausea, vomiting, abdominal pain and bowel incontinence.  Genitourinary: Negative for bladder incontinence, dysuria, urgency and frequency.  Musculoskeletal: Positive for back pain.  Skin: Negative for rash and wound.  Neurological: Negative for tingling, syncope, weakness, numbness and headaches.  Psychiatric/Behavioral: Negative for confusion. The patient is not nervous/anxious.       Allergies  Morphine and related  Home Medications   Current Outpatient Rx  Name  Route  Sig  Dispense  Refill  . Lansoprazole (PREVACID PO)   Oral   Take by mouth.         . terbinafine (LAMISIL) 250 MG tablet   Oral   Take 1 tablet (250 mg total) by mouth daily. For 3 months.   30 tablet   2    BP 130/85  Pulse 91  Temp(Src) 97.8 F (36.6 C) (Oral)  Resp 16  Ht 6\' 3"  (1.905 m)  Wt 219 lb (99.338 kg)  BMI 27.37 kg/m2  SpO2 98% Physical Exam  Nursing note and vitals reviewed. Constitutional: He is oriented to person, place, and time. He appears well-developed and well-nourished. No distress.  HENT:  Head: Normocephalic and atraumatic.  Eyes: EOM are normal. Pupils are equal, round, and reactive to light.  Neck: Normal range of motion. Neck supple.  Cardiovascular: Normal rate and regular rhythm.   Pulmonary/Chest: Effort normal. No respiratory distress. He has no wheezes. He has no rales.  Abdominal: Soft. Bowel sounds are normal. There is no tenderness.  Musculoskeletal: Normal range of motion. He exhibits no edema.       Lumbar back: He exhibits tenderness and spasm. He exhibits normal range of motion, no deformity and normal pulse.  Neurological: He is alert and oriented to person, place, and time. He has normal strength. No cranial nerve deficit or sensory deficit. Coordination and gait normal.  Reflex Scores:      Bicep reflexes are 2+ on  the right side and 2+ on the left side.      Brachioradialis reflexes are 2+ on the right side and 2+ on the left side.      Patellar reflexes are 2+ on the right side and 2+ on the left side.      Achilles reflexes are 2+ on the right side and 2+ on the left side. Pedal pulses equal, adequate circulation, straight leg raises without difficulty. Ambulatory without foot drag. Pain with any rang of motion of back.    Skin: Skin is warm and dry.  Psychiatric: He has a normal mood and affect. His behavior is normal.    ED Course  Procedures   MDM  28 y.o. male with low back pain s/p injury 2 weeks ago. Pain increasing. Will treat for pain and muscle spasm. Patient to follow up with the orthopedic doctor that he sees for his back problems. He will return here as needed. Patient stable for discharge with normal neuro exam. Discussed findings and plan of care in detail with the patient and he voices understanding.    Medication List    TAKE these medications       cyclobenzaprine 10 MG tablet  Commonly known as:  FLEXERIL  Take 1 tablet (10 mg total) by mouth 2 (two) times daily as needed for muscle spasms.     meloxicam 7.5 MG tablet  Commonly known as:  MOBIC  Take 1 tablet (7.5 mg total) by mouth daily.     oxyCODONE-acetaminophen 5-325 MG per tablet  Commonly known as:  ROXICET  Take 1 tablet by mouth every 6 (six) hours as needed for severe pain.      ASK your doctor about these medications       PREVACID PO  Take by mouth.     terbinafine 250 MG tablet  Commonly known as:  LAMISIL  Take 1 tablet (250 mg total) by mouth daily. For 3 months.          [facility] Orlene Och, NP 01/21/14 1249

## 2014-01-24 ENCOUNTER — Telehealth: Payer: Self-pay | Admitting: *Deleted

## 2014-01-24 NOTE — Telephone Encounter (Signed)
Pt called asking for a refill on oxycodone.  He was just given #20 on 3/21 in the ED.  I told him there was no way that you were going to refill that only 3 days later.  He stated that his back is always hurting & he's tired of taking meds all the time.  I offered to put in a PT referrel but he declined due to the busy sports schedule with his kids.  I also advised him that you may want him to come in for an OV to discuss other options.

## 2014-01-24 NOTE — Telephone Encounter (Signed)
Pt notified & was transferred to scheduling to get on Dr. Melvia Heaps's schedule.

## 2014-01-24 NOTE — Telephone Encounter (Signed)
Yes, there has been multiple visit for pain and given rx for narcotics. That is not treatment it is only masking problems. I do not prescribe long term narcotic management. Have you seen my partner Dr. Karie Schwalbe for musculoskelatal issues? He would be a good next step.

## 2014-01-27 ENCOUNTER — Ambulatory Visit (INDEPENDENT_AMBULATORY_CARE_PROVIDER_SITE_OTHER): Payer: Self-pay | Admitting: Sports Medicine

## 2014-01-27 ENCOUNTER — Encounter: Payer: Self-pay | Admitting: Sports Medicine

## 2014-01-27 ENCOUNTER — Telehealth: Payer: Self-pay | Admitting: *Deleted

## 2014-01-27 VITALS — BP 125/72 | HR 72 | Ht 75.0 in | Wt 222.0 lb

## 2014-01-27 DIAGNOSIS — M5137 Other intervertebral disc degeneration, lumbosacral region: Secondary | ICD-10-CM

## 2014-01-27 DIAGNOSIS — M5136 Other intervertebral disc degeneration, lumbar region: Secondary | ICD-10-CM

## 2014-01-27 DIAGNOSIS — M25569 Pain in unspecified knee: Secondary | ICD-10-CM

## 2014-01-27 DIAGNOSIS — M51369 Other intervertebral disc degeneration, lumbar region without mention of lumbar back pain or lower extremity pain: Secondary | ICD-10-CM

## 2014-01-27 DIAGNOSIS — M51379 Other intervertebral disc degeneration, lumbosacral region without mention of lumbar back pain or lower extremity pain: Secondary | ICD-10-CM

## 2014-01-27 DIAGNOSIS — M222X2 Patellofemoral disorders, left knee: Secondary | ICD-10-CM

## 2014-01-27 LAB — CULTURE, FUNGUS WITHOUT SMEAR

## 2014-01-27 MED ORDER — MELOXICAM 15 MG PO TABS: ORAL_TABLET | ORAL | Status: DC

## 2014-01-27 MED ORDER — DEXAMETHASONE 4 MG PO TABS: 4.0000 mg | ORAL_TABLET | Freq: Two times a day (BID) | ORAL | Status: DC

## 2014-01-27 MED ORDER — GABAPENTIN 300 MG PO CAPS: ORAL_CAPSULE | ORAL | Status: DC

## 2014-01-27 NOTE — Progress Notes (Signed)
   Subjective:    I'm seeing this patient as a consultation for:  Theodore GawJade Breeback, PA-C  CC: Low back pain  HPI: This is a pleasant 28 year old male, I have seen him in the past for knee pain which has since resolved with conservative measures. For over a year now he's had low back pain he localizes in the mid lumbar spine, moderate, persistent with all axial and no radicular symptoms. It's worse when sitting down and riding in a car for long periods of time, it's also worse with standing up straight and he does feel as though his pain is better with leaning slightly forward such as pushing a shopping cart. Is not worse with Valsalva and he denies any bowel or bladder changes or constitutional symptoms, no trauma. He works as a Copywriter, advertisinglineman, and is lifting and pushing heavy objects daily. He has been in the emergency department several times for his back and has been prescribed narcotics multiple times.  Past medical history, Surgical history, Family history not pertinant except as noted below, Social history, Allergies, and medications have been entered into the medical record, reviewed, and no changes needed.   Review of Systems: No headache, visual changes, nausea, vomiting, diarrhea, constipation, dizziness, abdominal pain, skin rash, fevers, chills, night sweats, weight loss, swollen lymph nodes, body aches, joint swelling, muscle aches, chest pain, shortness of breath, mood changes, visual or auditory hallucinations.   Objective:   General: Well Developed, well nourished, and in no acute distress.  Neuro/Psych: Alert and oriented x3, extra-ocular muscles intact, able to move all 4 extremities, sensation grossly intact. Skin: Warm and dry, no rashes noted.  Respiratory: Not using accessory muscles, speaking in full sentences, trachea midline.  Cardiovascular: Pulses palpable, no extremity edema. Abdomen: Does not appear distended. Back Exam:  Inspection: Unremarkable  Motion: Flexion 45 deg,  Extension 45 deg, Side Bending to 45 deg bilaterally,  Rotation to 45 deg bilaterally  SLR laying: Negative  XSLR laying: Negative  Palpable tenderness: None. FABER: negative. Sensory change: Gross sensation intact to all lumbar and sacral dermatomes.  Reflexes: 2+ at both patellar tendons, 2+ at achilles tendons, Babinski's downgoing.  Strength at foot  Plantar-flexion: 5/5 Dorsi-flexion: 5/5 Eversion: 5/5 Inversion: 5/5  Leg strength  Quad: 5/5 Hamstring: 5/5 Hip flexor: 5/5 Hip abductors: 5/5  Gait unremarkable.  X-rays were personally reviewed, though the radiologist report is negative, there are significant anterior endplate osteophytes at the L3-L4 level worse at the proximal L4 vertebrae. This is suggestive of underlying degenerative disc disease.  Impression and Recommendations:   This case required medical decision making of moderate complexity.

## 2014-01-27 NOTE — Telephone Encounter (Signed)
Pt states he doesn't have insurance at this time and won't have it until 2 months from now. He ask if there is something you can prescribe/do for him until insurance is available. States at this time he is not financially able to pay for even the half off.

## 2014-01-27 NOTE — Assessment & Plan Note (Signed)
This has resolved with conservative measures.

## 2014-01-27 NOTE — Assessment & Plan Note (Signed)
Theodore Walker does have classic symptoms of lumbar spinal stenosis. Pain is predominantly axial, no radicular component.  On my personal review of his x-ray, he does have anterior endplate osteophytosis at the L3-L4 level suggestive of disc disease. This is present for over a year, we are going to obtain an MRI for interventional injection planning. Decadron twice a day, I would recommend no further narcotics. Double Mobic to 15. Home rehabilitation exercises, return to see me go for MRI results.

## 2014-01-27 NOTE — Telephone Encounter (Signed)
Pt informed and will call us back when he is ready to do the MRI Lumbar.  Meyer CoryMisty Imagine Nest, LPN

## 2014-01-27 NOTE — Telephone Encounter (Signed)
I will add gabapentin up taper, I had also doubled meloxicam, no narcotics sorry, bad idea for lumbar DDD.

## 2014-02-02 ENCOUNTER — Emergency Department (HOSPITAL_BASED_OUTPATIENT_CLINIC_OR_DEPARTMENT_OTHER)
Admission: EM | Admit: 2014-02-02 | Discharge: 2014-02-02 | Disposition: A | Discharge status: Home / Self Care | Payer: Self-pay | Attending: Emergency Medicine | Admitting: Emergency Medicine

## 2014-02-02 ENCOUNTER — Emergency Department (HOSPITAL_BASED_OUTPATIENT_CLINIC_OR_DEPARTMENT_OTHER): Payer: Self-pay

## 2014-02-02 ENCOUNTER — Encounter (HOSPITAL_BASED_OUTPATIENT_CLINIC_OR_DEPARTMENT_OTHER): Payer: Self-pay | Admitting: Emergency Medicine

## 2014-02-02 DIAGNOSIS — X500XXA Overexertion from strenuous movement or load, initial encounter: Secondary | ICD-10-CM | POA: Insufficient documentation

## 2014-02-02 DIAGNOSIS — IMO0002 Reserved for concepts with insufficient information to code with codable children: Secondary | ICD-10-CM | POA: Insufficient documentation

## 2014-02-02 DIAGNOSIS — K589 Irritable bowel syndrome without diarrhea: Secondary | ICD-10-CM | POA: Insufficient documentation

## 2014-02-02 DIAGNOSIS — Z79899 Other long term (current) drug therapy: Secondary | ICD-10-CM | POA: Insufficient documentation

## 2014-02-02 DIAGNOSIS — Y929 Unspecified place or not applicable: Secondary | ICD-10-CM | POA: Insufficient documentation

## 2014-02-02 DIAGNOSIS — S92009A Unspecified fracture of unspecified calcaneus, initial encounter for closed fracture: Secondary | ICD-10-CM | POA: Insufficient documentation

## 2014-02-02 DIAGNOSIS — S92002A Unspecified fracture of left calcaneus, initial encounter for closed fracture: Secondary | ICD-10-CM

## 2014-02-02 DIAGNOSIS — K219 Gastro-esophageal reflux disease without esophagitis: Secondary | ICD-10-CM | POA: Insufficient documentation

## 2014-02-02 DIAGNOSIS — Y9339 Activity, other involving climbing, rappelling and jumping off: Secondary | ICD-10-CM | POA: Insufficient documentation

## 2014-02-02 DIAGNOSIS — Z791 Long term (current) use of non-steroidal anti-inflammatories (NSAID): Secondary | ICD-10-CM | POA: Insufficient documentation

## 2014-02-02 MED ORDER — HYDROCODONE-ACETAMINOPHEN 5-325 MG PO TABS: 1.0000 | ORAL_TABLET | Freq: Four times a day (QID) | ORAL | Status: DC | PRN

## 2014-02-02 MED ORDER — TRAMADOL HCL 50 MG PO TABS
50.0000 mg | ORAL_TABLET | Freq: Once | ORAL | Status: AC
  Administered 2014-02-02: 50 mg via ORAL
  Filled 2014-02-02: qty 1

## 2014-02-02 NOTE — ED Provider Notes (Signed)
CSN: [id number]     Arrival date & time 02/02/14  1704 History   First MD Initiated Contact with Patient 02/02/14 1710     Chief Complaint  Patient presents with  . Ankle Injury     (Consider location/radiation/quality/duration/timing/severity/associated sxs/prior Treatment) HPI Comments: Pt states that he jumped off the bed of the truck and turned his ankle. Pt c/o left lateral ankle and foot pain  Patient is a 28 y.o. male presenting with lower extremity injury. The history is provided by the patient. No language interpreter was used.  Ankle Injury This is a new problem. The current episode started today. The problem occurs constantly. The problem has been unchanged.    Past Medical History  Diagnosis Date  . GERD (gastroesophageal reflux disease)   . IBS (irritable bowel syndrome)   . Hiatal hernia   . Spastic colon   . Appendicitis   . GERD (gastroesophageal reflux disease)    Past Surgical History  Procedure Laterality Date  . Knee surgery    . Nose surgery    . Vasectomy    . Appendectomy     Family History  Problem Relation Age of Onset  . Hypertension Mother   . Diabetes Mother    History  Substance Use Topics  . Smoking status: Never Smoker   . Smokeless tobacco: Current User    Types: Chew  . Alcohol Use: No    Review of Systems  Constitutional: Negative.   Respiratory: Negative.   Cardiovascular: Negative.       Allergies  Morphine and related  Home Medications   Current Outpatient Rx  Name  Route  Sig  Dispense  Refill  . cyclobenzaprine (FLEXERIL) 10 MG tablet   Oral   Take 1 tablet (10 mg total) by mouth 2 (two) times daily as needed for muscle spasms.   20 tablet   0   . dexamethasone (DECADRON) 4 MG tablet   Oral   Take 1 tablet (4 mg total) by mouth 2 (two) times daily with a meal.   10 tablet   0   . gabapentin (NEURONTIN) 300 MG capsule      One tab PO qHS for a week, then BID for a week, then TID. May double weekly to a max  of 3,600mg /day   180 capsule   3   . Lansoprazole (PREVACID PO)   Oral   Take by mouth.         . meloxicam (MOBIC) 15 MG tablet      One tab PO qAM with breakfast for 2 weeks, then daily prn pain.   30 tablet   3   . terbinafine (LAMISIL) 250 MG tablet   Oral   Take 1 tablet (250 mg total) by mouth daily. For 3 months.   30 tablet   2    BP 119/74  Pulse 96  Temp(Src) 98.5 F (36.9 C) (Oral)  Resp 16  Ht 6\' 3"  (1.905 m)  Wt 200 lb (90.719 kg)  BMI 25.00 kg/m2  SpO2 100% Physical Exam  Nursing note and vitals reviewed. Constitutional: He is oriented to person, place, and time. He appears well-developed and well-nourished.  Cardiovascular: Normal rate and regular rhythm.   Pulmonary/Chest: Effort normal and breath sounds normal.  Musculoskeletal: Normal range of motion.  No obvious deformity or swelling noted to the area. Pt has full rom: generalized tenderness  Neurological: He is alert and oriented to person, place, and time. Coordination normal.  Skin:  Skin is warm and dry.    ED Course  Procedures (including critical care time) Labs Review Labs Reviewed - No data to display Imaging Review Dg Ankle Complete Left  02/02/2014   CLINICAL DATA:  Injury  EXAM: LEFT ANKLE COMPLETE - 3+ VIEW  COMPARISON:  None.  FINDINGS: There is no evidence of fracture, dislocation, or joint effusion. There is no evidence of arthropathy or other focal bone abnormality. Soft tissues are unremarkable.  IMPRESSION: Negative.   Electronically Signed   By: Maryclare BeanArt  Hoss M.D.   On: 02/02/2014 17:38   Dg Foot Complete Left  02/02/2014   CLINICAL DATA:  Injured foot jumping off a truck.  EXAM: LEFT FOOT - COMPLETE 3+ VIEW  COMPARISON:  None.  FINDINGS: The joint spaces are maintained. There is a small avulsion fracture off the lateral aspect of the distal calcaneus.  IMPRESSION: Small avulsion fracture involving the distal lateral calcaneus.   Electronically Signed   By: Loralie ChampagneMark  Gallerani M.D.   On:  02/02/2014 17:40     EKG Interpretation None      MDM   Final diagnoses:  Left calcaneal fracture    Pt neurovascularly intact. Pt placed in cam walker and crutches given hydrocodone and follow up with Dr. Vivi Barrackhudnall    Barri Neidlinger, NP 02/02/14 1753

## 2014-02-02 NOTE — Discharge Instructions (Signed)
Fracture A fracture is a break in a bone, due to a force on the bone that is greater than the bone's strength can handle. There are many types of fractures, including:  Complete fracture: The break passes completely through the bone.  Displaced: The ends of the bone fragments are not properly aligned.  Non-displaced: The ends of the bone fragments are in proper alignment.  Incomplete fracture (greenstick): The break does not pass completely through the bone. Incomplete fractures may or may not be angular (angulated).  Open fracture (compound): Part of the broken bone pokes through the skin. Open fractures have a high risk for infection.  Closed fracture: The fracture has not broken through the skin.  Comminuted fracture: The bone is broken into more than two pieces.  Compression fracture: The break occurs from extreme pressure on the bone (includes crushing injury).  Impacted fracture: The broken bone ends have been driven into each other.  Avulsion fracture: A ligament or tendon pulls a small piece of bone off from the main bony segment.  Pathologic fracture: A fracture due to the bone being made weak by a disease (osteoporosis or tumors).  Stress fracture: A fracture caused by intense exercise or repetitive and prolonged pressure that makes the bone weak. SYMPTOMS   Pain, tenderness, bleeding, bruising, and swelling at the fracture site.  Weakness and inability to bear weight on the injured extremity.  Paleness and deformity (sometimes).  Loss of pulse, numbness, tingling, or paralysis below the fracture site (usually a limb); these are emergencies. CAUSES  Bone being subjected to a force greater than its strength. RISK INCREASES WITH:  Contact sports and falls from heights.  Previous or current bone problems (osteoporosis or tumors).  Poor balance.  Poor strength and flexibility. PREVENTION   Warm up and stretch properly before activity.  Maintain physical  fitness:  Cardiovascular fitness.  Muscle strength.  Flexibility and endurance.  Wear proper protective equipment.  Use proper exercise technique. RELATED COMPLICATIONS   Bone fails to heal (nonunion).  Bone heals in a poor position (malunion).  Low blood volume (hypovolemic), shock due to blood loss.  Clump of fat cells travels through the blood (fat embolus) from the injury site to the lungs or brain (more common with thigh fractures).  Obstruction of nearby arteries. TREATMENT  Treatment first requires realigning of the bones (reduction) by a medically trained person, if the fracture is displaced. After realignment if the fracture is completed, or for non-displaced fractures, ice and medicine are used to reduce pain and inflammation. The bone and adjacent joints are then restrained with a splint, cast, or brace to allow the bones to heal without moving. Surgery is sometimes needed, to reposition the bones and hold the position with rods, pins, plates, or screws. Restraint for long periods of time may result in muscle and joint weakness or build up of fluid in tissues (edema). For this reason, physical therapy is often needed to regain strength and full range of motion. Recovery is complete when there is no bone motion at the fracture site and x-rays (radiographs) show complete healing.  MEDICATION   General anesthesia, sedation, or muscle relaxants may be needed to allow for realignment of the fracture. If pain medicine is needed, nonsteroidal anti-inflammatory medicines (aspirin and ibuprofen), or other minor pain relievers (acetaminophen), are often advised.  Do not take pain medicine for 7 days before surgery.  Stronger pain relievers may be prescribed by your caregiver. Use only as directed and only as much   as you need. SEEK MEDICAL CARE IF:   The following occur after restraint or surgery. (Report any of these signs immediately):  Swelling above or below the fracture  site.  Severe, persistent pain.  Blue or gray skin below the fracture site, especially under the nails. Numbness or loss of feeling below the fracture site. Document Released: 10/20/2005 Document Revised: 10/06/2012 Document Reviewed: 02/01/2009 Exit[employer] Patient Information 2014 [employer].  
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
## 2014-02-02 NOTE — ED Provider Notes (Signed)
Medical screening examination/treatment/procedure(s) were performed by non-physician practitioner and as supervising physician I was immediately available for consultation/collaboration.   EKG Interpretation None        Rolan BuccoMelanie Jammy Plotkin, MD 02/02/14 1815

## 2014-02-06 ENCOUNTER — Ambulatory Visit (INDEPENDENT_AMBULATORY_CARE_PROVIDER_SITE_OTHER): Payer: Self-pay | Admitting: Physician Assistant

## 2014-02-06 ENCOUNTER — Encounter: Payer: Self-pay | Admitting: Physician Assistant

## 2014-02-06 VITALS — BP 126/63 | HR 109 | Ht 75.0 in | Wt 212.0 lb

## 2014-02-06 DIAGNOSIS — S92002A Unspecified fracture of left calcaneus, initial encounter for closed fracture: Secondary | ICD-10-CM

## 2014-02-06 DIAGNOSIS — S92009A Unspecified fracture of unspecified calcaneus, initial encounter for closed fracture: Secondary | ICD-10-CM

## 2014-02-06 MED ORDER — HYDROCODONE-ACETAMINOPHEN 7.5-325 MG PO TABS: 1.0000 | ORAL_TABLET | Freq: Three times a day (TID) | ORAL | Status: DC | PRN

## 2014-02-07 NOTE — Progress Notes (Signed)
   Subjective:    Patient ID: Theodore Walker, male    DOB: December 27, 1985, 28 y.o.   MRN: [id number]  HPI Pt presents to the clinic to follow up after ED visit on 02/02/2014. He was diagnosed with left calcaneal fracture and placed in ankle brace. He is wearing normal shoes today with ace bandage. He states the ankle brace seems to be irritating his leg and pulling his hairs. He is still in 9/10 pain with weight bearing. He was supposed to be using crutches but states just too hard to use with his job. Given 15 norco on 02/02/14.   Review of Systems     Objective:   Physical Exam  Constitutional: He appears well-developed and well-nourished.  HENT:  Head: Normocephalic and atraumatic.  Cardiovascular: Normal rate, regular rhythm and normal heart sounds.   Pulmonary/Chest: Effort normal and breath sounds normal.  Musculoskeletal:  ROM of left foot limited due to pain in all directions. Pain with moving toes. Strength 2/5 left foot. Mild swelling and small bruise over anterior foot, lateral side above and to the left of lateral malleolus over where left calcaneal fracture with correlation. Pain with palpation over lateral anterior with correlation to fracture. No ankle swelling.   Psychiatric: He has a normal mood and affect. His behavior is normal.             Left calcaneal fracture-reviewed xray on 02/02/14. Will switch ankle brace to cam boot walker with gel insert. Follow up with Dr. Karie Schwalbe if pain not improving or in 4 weeks. Hopefully taking the weight bearing pressure off left foot will help with pain and healing. i was unable to log into controlled substance website today.  I did give norco 7.5/325 quantity 15. Discussed using tylenol for pain control after that. Ongoing narcotics is not intended for primary care.    Pt has been having suspicious behavior as far as narcotics. He has had multiple visit to ER, Urgent Care and PCP for procedures and/or pain. I did address this with him and he  stated that it has been a rough couple of months with fluke things happening to him as well as ongoing pain for lumbar DDD. He denies abuse of narcotics but does feel like he needs them to be out of pain. Discuss pain clinic if he feels he has to have narcotics.   Spent 30 minutes with patient and greater than 50 percent of visit spent counseling pt regarding narcotic usage.

## 2014-02-08 ENCOUNTER — Other Ambulatory Visit: Payer: Self-pay | Admitting: Physician Assistant

## 2014-02-08 ENCOUNTER — Telehealth: Payer: Self-pay | Admitting: *Deleted

## 2014-02-08 MED ORDER — CYCLOBENZAPRINE HCL 10 MG PO TABS: 10.0000 mg | ORAL_TABLET | Freq: Two times a day (BID) | ORAL | Status: DC | PRN

## 2014-02-08 NOTE — Telephone Encounter (Signed)
Faxed

## 2014-02-08 NOTE — Telephone Encounter (Signed)
Ok sent!

## 2014-02-08 NOTE — Telephone Encounter (Signed)
Pt left message asking if you would send him in a muscle relaxer.  He stated that he is having a twitch in his foot.

## 2014-02-09 ENCOUNTER — Other Ambulatory Visit: Payer: Self-pay | Admitting: Physician Assistant

## 2014-02-09 NOTE — Telephone Encounter (Signed)
Please advise. Pt was given 15 Norco on 02-06-14. Muscle relaxer sent in 02/08/14. Theodore pt.

## 2014-02-10 ENCOUNTER — Telehealth: Payer: Self-pay

## 2014-02-10 MED ORDER — TRAMADOL HCL 50 MG PO TABS: 50.0000 mg | ORAL_TABLET | Freq: Three times a day (TID) | ORAL | Status: DC | PRN

## 2014-02-10 NOTE — Telephone Encounter (Signed)
Theodore Walker wants a refill on tramadol. Did you want this medication to be long term? [facility]

## 2014-02-10 NOTE — Telephone Encounter (Signed)
Faxed to [facility] 

## 2014-02-10 NOTE — Telephone Encounter (Signed)
Its ok to refill and yes we can rx this long term.

## 2014-03-10 ENCOUNTER — Ambulatory Visit (INDEPENDENT_AMBULATORY_CARE_PROVIDER_SITE_OTHER): Payer: Self-pay | Admitting: Physician Assistant

## 2014-03-10 ENCOUNTER — Encounter: Payer: Self-pay | Admitting: Physician Assistant

## 2014-03-10 VITALS — BP 110/62 | HR 60 | Wt 218.0 lb

## 2014-03-10 DIAGNOSIS — F1311 Sedative, hypnotic or anxiolytic abuse, in remission: Secondary | ICD-10-CM

## 2014-03-10 DIAGNOSIS — R4586 Emotional lability: Secondary | ICD-10-CM

## 2014-03-10 DIAGNOSIS — F1111 Opioid abuse, in remission: Secondary | ICD-10-CM

## 2014-03-10 DIAGNOSIS — R454 Irritability and anger: Secondary | ICD-10-CM

## 2014-03-10 DIAGNOSIS — R4589 Other symptoms and signs involving emotional state: Secondary | ICD-10-CM

## 2014-03-10 DIAGNOSIS — F39 Unspecified mood [affective] disorder: Secondary | ICD-10-CM
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
## 2014-03-10 NOTE — Patient Instructions (Signed)
Anger Management  Anger is a normal human emotion. However, anger can range from mild irritation to rage. When your anger becomes harmful to yourself or others, it is unhealthy anger.   CAUSES   There are many reasons for unhealthy anger. Many people learn how to express anger from observing how their family expressed anger. In troubled, chaotic, or abusive families, anger can be expressed as rage or even violence. Children can grow up never learning how healthy anger can be expressed. Factors that contribute to unhealthy anger include:    Drug or alcohol abuse.   Post-traumatic stress disorder.   Traumatic brain injury.  COMPLICATIONS   People with unhealthy anger tend to overreact and retaliate against a real or imagined threat. The need to retaliate can turn into violence or verbal abuse against another person. Chronic anger can lead to health problems, such as hypertension, high blood pressure, and depression.  TREATMENT   Exercising, relaxing, meditating, or writing out your feelings all can be beneficial in managing moderate anger. For unhealthy anger, the following methods may be used:   Cognitive-behavioral counseling (learning skills to change the thoughts that influence your mood).   Relaxation training.   Interpersonal counseling.   Assertive communication skills.   Medication.  Document Released: 08/17/2007 Document Revised: 01/12/2012 Document Reviewed: 12/26/2010  ExitCare Patient Information 2014 [employer].

## 2014-03-13 DIAGNOSIS — F1111 Opioid abuse, in remission: Secondary | ICD-10-CM | POA: Insufficient documentation

## 2014-03-15 ENCOUNTER — Other Ambulatory Visit: Payer: Self-pay | Admitting: Sports Medicine

## 2014-03-15 ENCOUNTER — Other Ambulatory Visit: Payer: Self-pay | Admitting: *Deleted

## 2014-03-15 MED ORDER — MELOXICAM 15 MG PO TABS: 15.0000 mg | ORAL_TABLET | Freq: Every day | ORAL | Status: DC

## 2014-03-15 NOTE — Telephone Encounter (Signed)
PT denied Tramadol or any narcotics per Jade. Mobic sent to pt's pharmacy. Pt aware.  Meyer CoryMisty Ahmad, LPN

## 2014-03-26 ENCOUNTER — Encounter (HOSPITAL_COMMUNITY): Payer: Self-pay | Admitting: Emergency Medicine

## 2014-03-26 ENCOUNTER — Emergency Department (HOSPITAL_COMMUNITY)
Admission: EM | Admit: 2014-03-26 | Discharge: 2014-03-26 | Disposition: A | Discharge status: Home / Self Care | Payer: Self-pay | Attending: Emergency Medicine | Admitting: Emergency Medicine

## 2014-03-26 ENCOUNTER — Emergency Department (HOSPITAL_COMMUNITY): Payer: Self-pay

## 2014-03-26 DIAGNOSIS — M25511 Pain in right shoulder: Secondary | ICD-10-CM

## 2014-03-26 DIAGNOSIS — Y9289 Other specified places as the place of occurrence of the external cause: Secondary | ICD-10-CM | POA: Insufficient documentation

## 2014-03-26 DIAGNOSIS — S46909A Unspecified injury of unspecified muscle, fascia and tendon at shoulder and upper arm level, unspecified arm, initial encounter: Secondary | ICD-10-CM | POA: Insufficient documentation

## 2014-03-26 DIAGNOSIS — S4980XA Other specified injuries of shoulder and upper arm, unspecified arm, initial encounter: Secondary | ICD-10-CM | POA: Insufficient documentation

## 2014-03-26 DIAGNOSIS — Y9317 Activity, water skiing and wake boarding: Secondary | ICD-10-CM | POA: Insufficient documentation

## 2014-03-26 DIAGNOSIS — K219 Gastro-esophageal reflux disease without esophagitis: Secondary | ICD-10-CM | POA: Insufficient documentation

## 2014-03-26 DIAGNOSIS — Z79899 Other long term (current) drug therapy: Secondary | ICD-10-CM | POA: Insufficient documentation

## 2014-03-26 DIAGNOSIS — Z8739 Personal history of other diseases of the musculoskeletal system and connective tissue: Secondary | ICD-10-CM

## 2014-03-26 DIAGNOSIS — R296 Repeated falls: Secondary | ICD-10-CM | POA: Insufficient documentation

## 2014-03-26 MED ORDER — IBUPROFEN 800 MG PO TABS: 800.0000 mg | ORAL_TABLET | Freq: Three times a day (TID) | ORAL | Status: DC

## 2014-03-26 MED ORDER — TRAMADOL HCL 50 MG PO TABS: 50.0000 mg | ORAL_TABLET | Freq: Four times a day (QID) | ORAL | Status: DC | PRN
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
## 2014-03-26 NOTE — Discharge Instructions (Signed)
   Shoulder Pain The shoulder is the joint that connects your arms to your body. The bones that form the shoulder joint include the upper arm bone (humerus), the shoulder blade (scapula), and the collarbone (clavicle). The top of the humerus is shaped like a ball and fits into a rather flat socket on the scapula (glenoid cavity). A combination of muscles and strong, fibrous tissues that connect muscles to bones (tendons) support your shoulder joint and hold the ball in the socket. Small, fluid-filled sacs (bursae) are located in different areas of the joint. They act as cushions between the bones and the overlying soft tissues and help reduce friction between the gliding tendons and the bone as you move your arm. Your shoulder joint allows a wide range of motion in your arm. This range of motion allows you to do things like scratch your back or throw a ball. However, this range of motion also makes your shoulder more prone to pain from overuse and injury. Causes of shoulder pain can originate from both injury and overuse and usually can be grouped in the following four categories:  Redness, swelling, and pain (inflammation) of the tendon (tendinitis) or the bursae (bursitis).  Instability, such as a dislocation of the joint.  Inflammation of the joint (arthritis).  Broken bone (fracture). HOME CARE INSTRUCTIONS   Apply ice to the sore area.  Put ice in a plastic bag.  Place a towel between your skin and the bag.  Leave the ice on for 15-20 minutes, 03-04 times per day for the first 2 days.  Stop using cold packs if they do not help with the pain.  If you have a shoulder sling or immobilizer, wear it as long as your caregiver instructs. Only remove it to shower or bathe. Move your arm as little as possible, but keep your hand moving to prevent swelling.  Squeeze a soft ball or foam pad as much as possible to help prevent swelling.  Only take over-the-counter or prescription medicines for  pain, discomfort, or fever as directed by your caregiver. SEEK MEDICAL CARE IF:   Your shoulder pain increases, or new pain develops in your arm, hand, or fingers.  Your hand or fingers become cold and numb.  Your pain is not relieved with medicines. SEEK IMMEDIATE MEDICAL CARE IF:   Your arm, hand, or fingers are numb or tingling.  Your arm, hand, or fingers are significantly swollen or turn white or blue. MAKE SURE YOU:   Understand these instructions.  Will watch your condition.  Will get help right away if you are not doing well or get worse. Document Released: 07/30/2005 Document Revised: 07/14/2012 Document Reviewed: 10/04/2011 ExitCare Patient Information 2014 [employer].  
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
## 2014-03-26 NOTE — ED Notes (Signed)
Pt denies taking anything at home for pain

## 2014-03-27 NOTE — ED Provider Notes (Signed)
Medical screening examination/treatment/procedure(s) were performed by non-physician practitioner and as supervising physician I was immediately available for consultation/collaboration.   EKG Interpretation None        Hanley Seamen, MD 03/27/14 2252

## 2014-04-02 ENCOUNTER — Encounter (HOSPITAL_BASED_OUTPATIENT_CLINIC_OR_DEPARTMENT_OTHER): Payer: Self-pay | Admitting: Emergency Medicine

## 2014-04-02 ENCOUNTER — Emergency Department (HOSPITAL_BASED_OUTPATIENT_CLINIC_OR_DEPARTMENT_OTHER)
Admission: EM | Admit: 2014-04-02 | Discharge: 2014-04-02 | Disposition: A | Discharge status: Home / Self Care | Payer: BC Managed Care – PPO | Attending: Emergency Medicine | Admitting: Emergency Medicine

## 2014-04-02 DIAGNOSIS — Z791 Long term (current) use of non-steroidal anti-inflammatories (NSAID): Secondary | ICD-10-CM | POA: Insufficient documentation

## 2014-04-02 DIAGNOSIS — M79674 Pain in right toe(s): Secondary | ICD-10-CM

## 2014-04-02 DIAGNOSIS — Z792 Long term (current) use of antibiotics: Secondary | ICD-10-CM | POA: Insufficient documentation

## 2014-04-02 DIAGNOSIS — K219 Gastro-esophageal reflux disease without esophagitis: Secondary | ICD-10-CM | POA: Insufficient documentation

## 2014-04-02 DIAGNOSIS — Z765 Malingerer [conscious simulation]: Secondary | ICD-10-CM | POA: Insufficient documentation

## 2014-04-02 DIAGNOSIS — M79609 Pain in unspecified limb: Secondary | ICD-10-CM | POA: Insufficient documentation

## 2014-04-02 MED ORDER — SULFAMETHOXAZOLE-TRIMETHOPRIM 800-160 MG PO TABS: 1.0000 | ORAL_TABLET | Freq: Two times a day (BID) | ORAL | Status: DC

## 2014-04-02 MED ORDER — MELOXICAM 15 MG PO TABS: 15.0000 mg | ORAL_TABLET | Freq: Every day | ORAL | Status: DC

## 2014-04-02 NOTE — ED Notes (Signed)
Patient here with ongoing right great toe pain with reported drainage and redness since having ingrown nail removed Thursday at [facility]. Taking keflex as prescribed and ibuprofen, patient concerned that he saw pus draining from same earlier

## 2014-04-02 NOTE — Discharge Instructions (Signed)

## 2014-04-02 NOTE — ED Provider Notes (Signed)
TIME SEEN: 9:11 AM  CHIEF COMPLAINT: Right toe pain  HPI: Pt is a 28 y.o. M with history of GERD, IBS who presents to the emergency department with right great toe pain. He reports that on Thursday, 3 days ago he had a temperature now removed. He has been taking Keflex and ibuprofen but states that it is not helping with his pain. He reports that he pulled out a "pus ball" earlier today. He denies any fever. He states "ibuprofen just isn't cutting it". He denies any other injury. No fevers. No nausea, vomiting or diarrhea.  ROS: See HPI Constitutional: no fever  Eyes: no drainage  ENT: no runny nose   Cardiovascular:  no chest pain  Resp: no SOB  GI: no vomiting GU: no dysuria Integumentary: no rash  Allergy: no hives  Musculoskeletal: no leg swelling  Neurological: no slurred speech ROS otherwise negative  PAST MEDICAL HISTORY/PAST SURGICAL HISTORY:  Past Medical History  Diagnosis Date  . GERD (gastroesophageal reflux disease)   . IBS (irritable bowel syndrome)   . Hiatal hernia   . Spastic colon   . Appendicitis   . GERD (gastroesophageal reflux disease)     MEDICATIONS:  Prior to Admission medications   Medication Sig Start Date End Date Taking? Authorizing Provider  doxycycline (VIBRAMYCIN) 100 MG capsule Take 100 mg by mouth 2 (two) times daily. For 10 days 03/23/14 04/02/14  Historical Provider, MD  ibuprofen (ADVIL,MOTRIN) 800 MG tablet Take 1 tablet (800 mg total) by mouth 3 (three) times daily. 03/26/14   Burgess AmorJulie Idol, PA-C  lansoprazole (PREVACID) 15 MG capsule Take 15 mg by mouth 2 (two) times daily.    Historical Provider, MD  meloxicam (MOBIC) 15 MG tablet Take 1 tablet (15 mg total) by mouth daily. 03/15/14   Jade L Breeback, PA-C  traMADol (ULTRAM) 50 MG tablet Take 1 tablet (50 mg total) by mouth every 6 (six) hours as needed. 03/26/14   Burgess AmorJulie Idol, PA-C    ALLERGIES:  Allergies  Allergen Reactions  . Morphine And Related Hives    SOCIAL HISTORY:  History   Substance Use Topics  . Smoking status: Never Smoker   . Smokeless tobacco: Current User    Types: Chew  . Alcohol Use: No    FAMILY HISTORY: Family History  Problem Relation Age of Onset  . Hypertension Mother   . Diabetes Mother     EXAM: There were no vitals taken for this visit. CONSTITUTIONAL: Alert and oriented and responds appropriately to questions. Well-appearing; well-nourished HEAD: Normocephalic EYES: Conjunctivae clear, PERRL ENT: normal nose; no rhinorrhea; moist mucous membranes; pharynx without lesions noted NECK: Supple, no meningismus, no LAD  CARD: RRR; S1 and S2 appreciated; no murmurs, no clicks, no rubs, no gallops RESP: Normal chest excursion without splinting or tachypnea; breath sounds clear and equal bilaterally; no wheezes, no rhonchi, no rales,  ABD/GI: Normal bowel sounds; non-distended; soft, non-tender, no rebound, no guarding BACK:  The back appears normal and is non-tender to palpation, there is no CVA tenderness EXT: Patient has dry blood at the lateral edges of his right great toe nailbed with no purulent drainage, there is a minimal amount of urothelium in this area but no warmth, no induration or fluctuance, no joint effusion, 2+ DP pulses bilaterally, otherwise Normal ROM in all joints; non-tender to palpation; no edema; normal capillary refill; no cyanosis    SKIN: Normal color for age and race; warm NEURO: Moves all extremities equally PSYCH: The patient's mood  and manner are appropriate. Grooming and personal hygiene are appropriate.  MEDICAL DECISION MAKING: Patient here with pain of his right great toe after having an ingrown toe nail 3 days ago. His foot does not appear infected and there is no purulent drainage currently. We'll add Bactrim to cover for MRSA. On review of birth, narcotic database, it appears the patient receives large prescriptions for narcotics at least 4 times a month. I discussed with patient given this, I do not  comfortable discharging him with any narcotics. He states "I'm not here for that anyway".  We'll give him prescription for [facility]. Have discussed return precautions and supportive care instructions. He verbalizes understanding and is comfortable plan. I discussed with patient that he feels he needs her contacts for pain control that he needs to talk to the physician that did the procedure. Suspect there is a component of drug seeking behavior.       Layla Maw Arush Gatliff, DO 04/02/14 [phone]

## 2014-04-21 ENCOUNTER — Emergency Department (INDEPENDENT_AMBULATORY_CARE_PROVIDER_SITE_OTHER)
Admission: EM | Admit: 2014-04-21 | Discharge: 2014-04-21 | Disposition: A | Discharge status: Home / Self Care | Payer: BC Managed Care – PPO | Source: Home / Self Care | Attending: Family Medicine | Admitting: Family Medicine

## 2014-04-21 ENCOUNTER — Encounter: Payer: Self-pay | Admitting: Emergency Medicine

## 2014-04-21 DIAGNOSIS — B029 Zoster without complications: Secondary | ICD-10-CM

## 2014-04-21 MED ORDER — DOXYCYCLINE HYCLATE 100 MG PO CAPS: 100.0000 mg | ORAL_CAPSULE | Freq: Two times a day (BID) | ORAL | Status: DC

## 2014-04-21 MED ORDER — HYDROCODONE-ACETAMINOPHEN 5-325 MG PO TABS: ORAL_TABLET | ORAL | Status: DC

## 2014-04-21 MED ORDER — DOMEBORO 25 % EX PACK: PACK | CUTANEOUS | Status: DC

## 2014-04-21 MED ORDER — VALACYCLOVIR HCL 1 G PO TABS: 1000.0000 mg | ORAL_TABLET | Freq: Three times a day (TID) | ORAL | Status: DC

## 2014-04-21 NOTE — ED Notes (Signed)
Reports 4 days of rash under right axilla; has tried Nystatin cream without resolution. The area is painful rather than itching; no other sites on body.

## 2014-04-21 NOTE — ED Provider Notes (Signed)
CSN: [id number]     Arrival date & time 04/21/14  1925 History   First MD Initiated Contact with Patient 04/21/14 2020     Chief Complaint  Patient presents with  . Rash     HPI Comments: Patient noticed pain in his right axilla four days ago, followed by development of a rash.  The area has remained painful and has not responded to nystatin cream.  He feels well otherwise.  No insect or tick bites.  No fevers, chills, and sweats   Patient is a 28 y.o. male presenting with rash. The history is provided by the patient.  Rash Pain location: right axilla. Pain quality: burning and cramping   Pain radiates to:  Does not radiate Pain severity:  Moderate Onset quality:  Sudden Duration:  4 days Timing:  Constant Progression:  Unchanged Relieved by:  Nothing Ineffective treatments: nystatin cream. Associated symptoms: no anorexia, no chest pain, no chills, no fatigue, no fever, no nausea, no sore throat and no vomiting     Past Medical History  Diagnosis Date  . GERD (gastroesophageal reflux disease)   . IBS (irritable bowel syndrome)   . Hiatal hernia   . Spastic colon   . Appendicitis   . GERD (gastroesophageal reflux disease)    Past Surgical History  Procedure Laterality Date  . Knee surgery    . Nose surgery    . Vasectomy    . Appendectomy     Family History  Problem Relation Age of Onset  . Hypertension Mother   . Diabetes Mother    History  Substance Use Topics  . Smoking status: Never Smoker   . Smokeless tobacco: Current User    Types: Chew  . Alcohol Use: No    Review of Systems  Constitutional: Negative for fever, chills and fatigue.  HENT: Negative for sore throat.   Cardiovascular: Negative for chest pain.  Gastrointestinal: Negative for nausea, vomiting and anorexia.  Skin: Positive for rash.    Allergies  Morphine and related  Home Medications   Prior to Admission medications   Medication Sig Start Date End Date Taking? Authorizing Provider   Alum Sulfate-Ca Acetate (DOMEBORO) 25 % PACK Dissolve in water and apply as a compress TID 04/21/14   Lattie HawStephen A Beese, MD  doxycycline (VIBRAMYCIN) 100 MG capsule Take 1 capsule (100 mg total) by mouth 2 (two) times daily. 04/21/14   Lattie HawStephen A Beese, MD  HYDROcodone-acetaminophen (NORCO/VICODIN) 5-325 MG per tablet Take one by mouth at bedtime as needed for pain 04/21/14   Lattie HawStephen A Beese, MD  ibuprofen (ADVIL,MOTRIN) 800 MG tablet Take 1 tablet (800 mg total) by mouth 3 (three) times daily. 03/26/14   Burgess AmorJulie Idol, PA-C  lansoprazole (PREVACID) 15 MG capsule Take 15 mg by mouth 2 (two) times daily.    Historical Provider, MD  valACYclovir (VALTREX) 1000 MG tablet Take 1 tablet (1,000 mg total) by mouth 3 (three) times daily. 04/21/14   Lattie HawStephen A Beese, MD   BP 115/77  Pulse 67  Temp(Src) 97.8 F (36.6 C) (Oral)  Resp 16  Ht 6\' 3"  (1.905 m)  Wt 209 lb (94.802 kg)  BMI 26.12 kg/m2  SpO2 98% Physical Exam  Nursing note and vitals reviewed. Constitutional: He is oriented to person, place, and time. He appears well-developed and well-nourished. No distress.  HENT:  Head: Normocephalic.  Mouth/Throat: Oropharynx is clear and moist.  Eyes: Conjunctivae are normal. Pupils are equal, round, and reactive to light.  Neck: Neck  supple. No thyromegaly present.  Cardiovascular: Normal heart sounds.   Pulmonary/Chest: Breath sounds normal.  Abdominal: There is no tenderness.  Neurological: He is alert and oriented to person, place, and time.  Skin: Skin is warm and dry. Rash noted. Rash is vesicular.  Right axilla has two vesicular erythematous patches.  One patch, in the anterior axillary line is 1cm by 5cm.  The other patch, in the posterior axillary line, is 1cm by 3cm.  No swelling.  The area is tender to palpation.    ED Course  Procedures  none      MDM   1. Herpes zoster    Begin Valtrex, and empiric Doxycycline for possible staph coverage.  Begin Domeboro soaks. Lortab at bedtime    Followup with dermatologist if not improving about 5 days.    Lattie HawStephen A Beese, MD 04/23/14 [phone]

## 2014-04-21 NOTE — Discharge Instructions (Signed)
Shingles °Shingles (herpes zoster) is an infection that is caused by the same virus that causes chickenpox (varicella). The infection causes a painful skin rash and fluid-filled blisters, which eventually break open, crust over, and heal. It may occur in any area of the body, but it usually affects only one side of the body or face. The pain of shingles usually lasts about 1 month. However, some people with shingles may develop long-term (chronic) pain in the affected area of the body. °Shingles often occurs many years after the person had chickenpox. It is more common: °· In people older than 50 years. °· In people with weakened immune systems, such as those with HIV, AIDS, or cancer. °· In people taking medicines that weaken the immune system, such as transplant medicines. °· In people under great stress. °CAUSES  °Shingles is caused by the varicella zoster virus (VZV), which also causes chickenpox. After a person is infected with the virus, it can remain in the person's body for years in an inactive state (dormant). To cause shingles, the virus reactivates and breaks out as an infection in a nerve root. °The virus can be spread from person to person (contagious) through contact with open blisters of the shingles rash. It will only spread to people who have not had chickenpox. When these people are exposed to the virus, they may develop chickenpox. They will not develop shingles. Once the blisters scab over, the person is no longer contagious and cannot spread the virus to others. °SYMPTOMS  °Shingles shows up in stages. The initial symptoms may be pain, itching, and tingling in an area of the skin. This pain is usually described as burning, stabbing, or throbbing. In a few days or weeks, a painful red rash will appear in the area where the pain, itching, and tingling were felt. The rash is usually on one side of the body in a band or belt-like pattern. Then, the rash usually turns into fluid-filled blisters. They  will scab over and dry up in approximately 2-3 weeks. °Flu-like symptoms may also occur with the initial symptoms, the rash, or the blisters. These may include: °· Fever. °· Chills. °· Headache. °· Upset stomach. °DIAGNOSIS  °Your caregiver will perform a skin exam to diagnose shingles. Skin scrapings or fluid samples may also be taken from the blisters. This sample will be examined under a microscope or sent to a lab for further testing. °TREATMENT  °There is no specific cure for shingles. Your caregiver will likely prescribe medicines to help you manage the pain, recover faster, and avoid long-term problems. This may include antiviral drugs, anti-inflammatory drugs, and pain medicines. °HOME CARE INSTRUCTIONS  °· Take a cool bath or apply cool compresses to the area of the rash or blisters as directed. This may help with the pain and itching.   °· Only take over-the-counter or prescription medicines as directed by your caregiver.   °· Rest as directed by your caregiver. °· Keep your rash and blisters clean with mild soap and cool water or as directed by your caregiver.  °· Do not pick your blisters or scratch your rash. Apply an anti-itch cream or numbing creams to the affected area as directed by your caregiver. °· Keep your shingles rash covered with a loose bandage (dressing). °· Avoid skin contact with: °¨ Babies.   °¨ Pregnant women.   °¨ Children with eczema.   °¨ Elderly people with transplants.   °¨ People with chronic illnesses, such as leukemia or AIDS.   °· Wear loose-fitting clothing to help ease the   pain of material rubbing against the rash. °· Keep all follow-up appointments with your caregiver. If the area involved is on your face, you may receive a referral for follow-up to a specialist, such as an eye doctor (ophthalmologist) or an ear, nose, and throat (ENT) doctor. Keeping all follow-up appointments will help you avoid eye complications, chronic pain, or disability.   °SEEK IMMEDIATE MEDICAL  CARE IF:  °· You have facial pain, pain around the eye area, or loss of feeling on one side of your face. °· You have ear pain or ringing in your ear. °· You have loss of taste. °· Your pain is not relieved with prescribed medicines.   °· Your redness or swelling spreads.   °· You have more pain and swelling.  °· Your condition is worsening or has changed.   °· You have a fever or persistent symptoms for more than 2-3 days. °· You have a fever and your symptoms suddenly get worse. °MAKE SURE YOU: °· Understand these instructions. °· Will watch your condition. °· Will get help right away if you are not doing well or get worse. °Document Released: 10/20/2005 Document Revised: 07/14/2012 Document Reviewed: 06/03/2012 °ExitCare® Patient Information ©2015 [employer]. This information is not intended to replace advice given to you by your health care provider. Make sure you discuss any questions you have with your health care provider. ° °

## 2014-04-25 ENCOUNTER — Telehealth: Payer: Self-pay | Admitting: Emergency Medicine

## 2014-10-30 IMAGING — CR DG LUMBAR SPINE COMPLETE 4+V
5 series · 5 of 5 positions shown · non-contrast
Comparison: None

CLINICAL DATA: Lifting injury, back pain.

EXAM:
LUMBAR SPINE - COMPLETE 4+ VIEW

[view not recorded (1 of 5)]
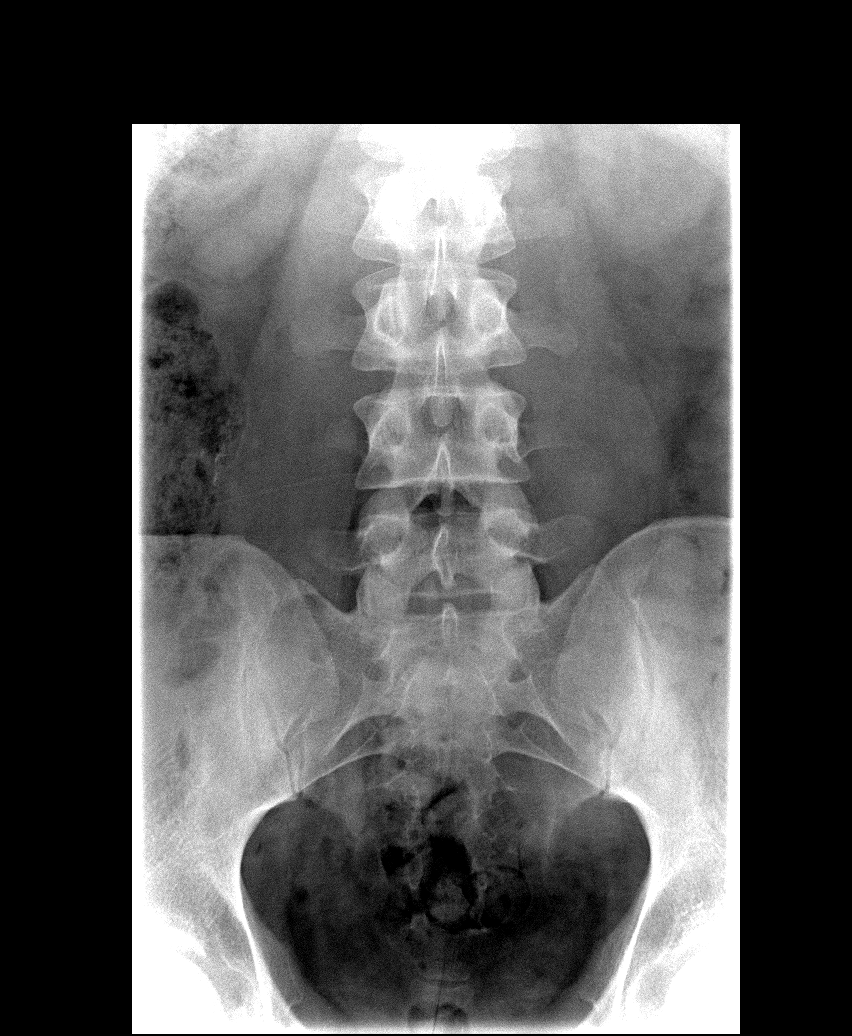

[view not recorded (2 of 5)]
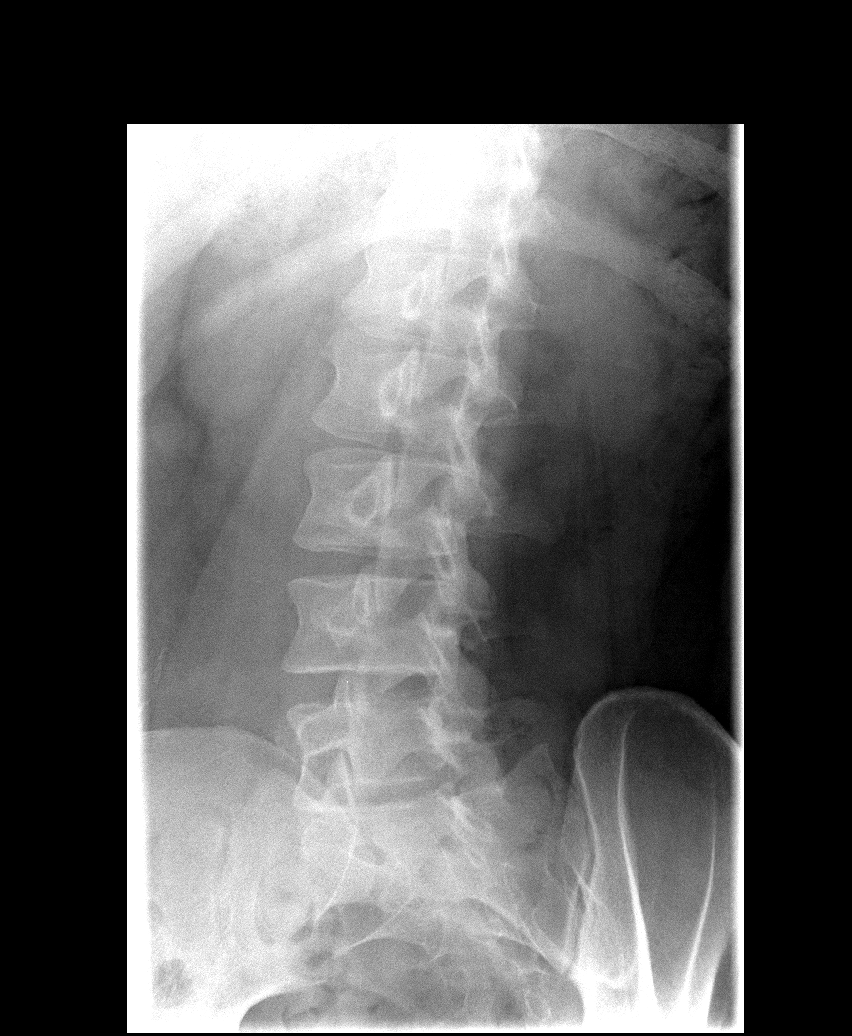

[view not recorded (3 of 5)]
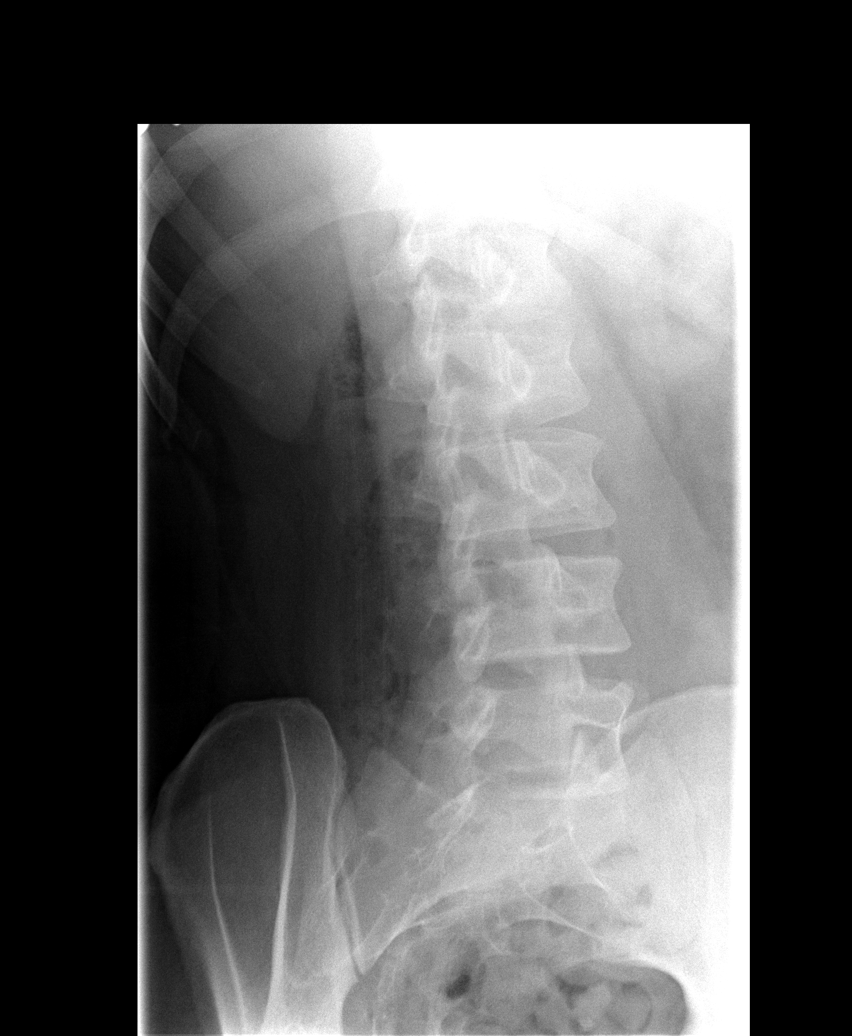

[view not recorded (4 of 5)]
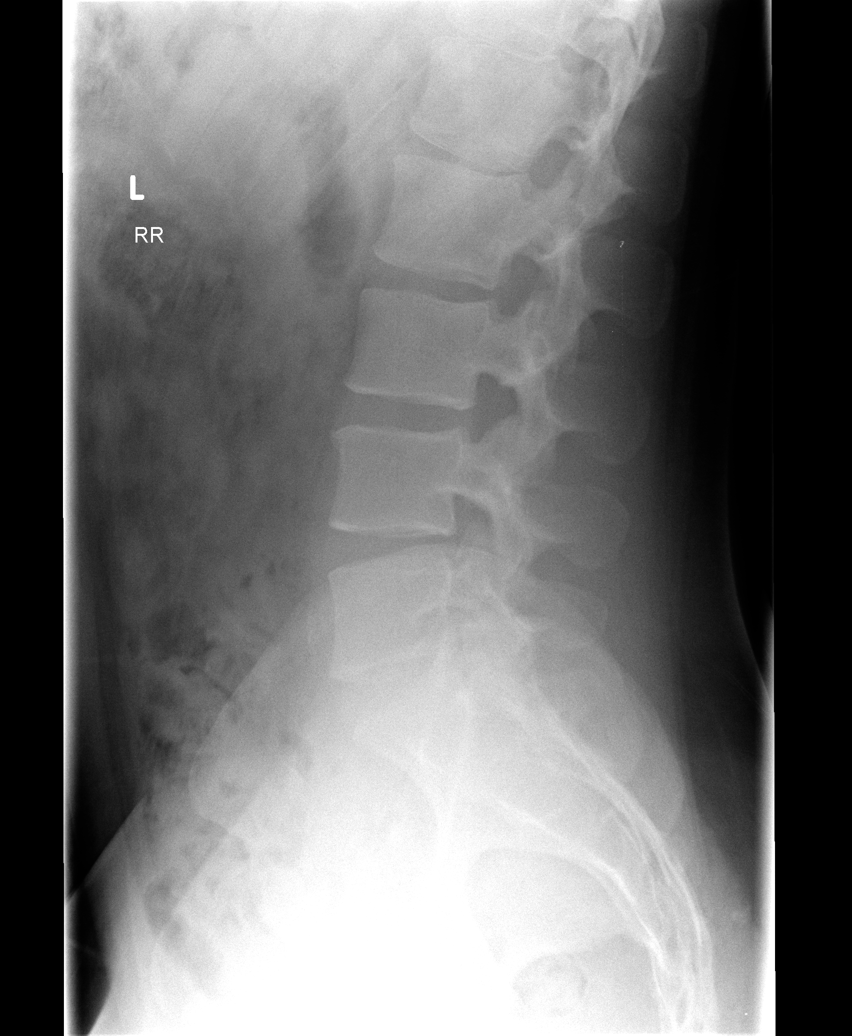

[view not recorded (5 of 5)]
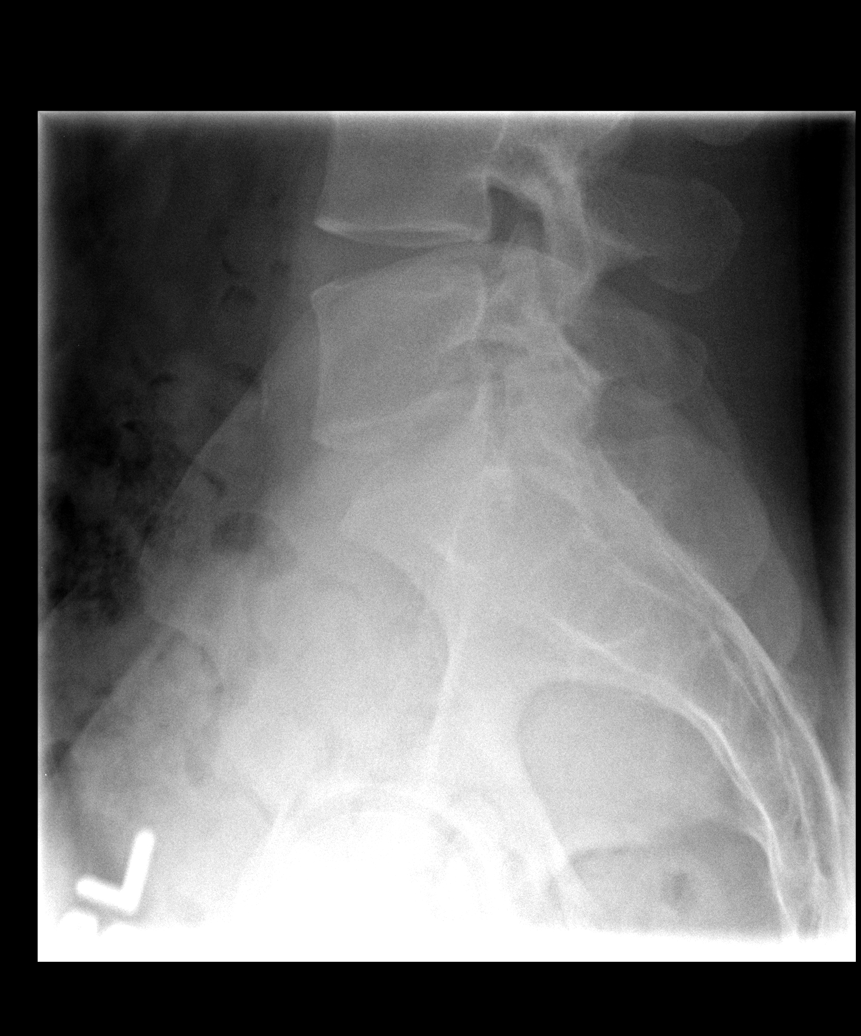

[5 of 5 positions shown; findings below may reference images not displayed]

FINDINGS: There is no evidence of lumbar spine fracture. Alignment is normal.
Intervertebral disc spaces are maintained.
IMPRESSION: Negative.

## 2014-10-31 ENCOUNTER — Ambulatory Visit (INDEPENDENT_AMBULATORY_CARE_PROVIDER_SITE_OTHER): Payer: BC Managed Care – PPO | Admitting: Physician Assistant

## 2014-10-31 ENCOUNTER — Encounter: Payer: Self-pay | Admitting: Physician Assistant

## 2014-10-31 VITALS — BP 120/73 | HR 67 | Temp 98.0°F | Ht 75.0 in | Wt 211.0 lb

## 2014-10-31 DIAGNOSIS — H6691 Otitis media, unspecified, right ear: Secondary | ICD-10-CM

## 2014-10-31 DIAGNOSIS — R05 Cough: Secondary | ICD-10-CM | POA: Diagnosis not present

## 2014-10-31 DIAGNOSIS — R059 Cough, unspecified: Secondary | ICD-10-CM

## 2014-10-31 DIAGNOSIS — H60391 Other infective otitis externa, right ear: Secondary | ICD-10-CM | POA: Diagnosis not present

## 2014-10-31 MED ORDER — KETOROLAC TROMETHAMINE 30 MG/ML IJ SOLN
30.0000 mg | Freq: Once | INTRAMUSCULAR | Status: AC
  Administered 2014-10-31: 30 mg via INTRAMUSCULAR

## 2014-10-31 MED ORDER — PREDNISONE 50 MG PO TABS: ORAL_TABLET | ORAL | Status: DC

## 2014-10-31 MED ORDER — ANTIPYRINE-BENZOCAINE 5.4-1.4 % OT SOLN: 3.0000 [drp] | OTIC | Status: DC | PRN

## 2014-10-31 MED ORDER — SULFAMETHOXAZOLE-TRIMETHOPRIM 800-160 MG PO TABS: 1.0000 | ORAL_TABLET | Freq: Two times a day (BID) | ORAL | Status: DC

## 2014-10-31 NOTE — Progress Notes (Signed)
   Subjective:    Patient ID: Theodore Walker, male    DOB: Nov 06, 1985, 28 y.o.   MRN: [id number]  HPI  Pt presents to the clinic with right ear pain that has not resolved. The day after christmas right ear pain started. Pain so bad went to ER. Dx of otitis externa and media. Treated with augmentin and polytrim. Given norco but does not help. toradol helped the most. Still having 7/10 ear pain. Some dry cough. No fever or chills. No sinus pressure or ST.  Ear just aches all the time. No overt hearing loss.    Review of Systems  All other systems reviewed and are negative.      Objective:   Physical Exam  Constitutional: He is oriented to person, place, and time. He appears well-developed and well-nourished.  HENT:  Head: Normocephalic and atraumatic.  Left Ear: External ear normal.  Nose: Nose normal.  Mouth/Throat: Oropharynx is clear and moist. No oropharyngeal exudate.  Right external ear patent but swollen and erythematous. Tenderness to palpation over tragus.  Right TM erythematous, buldging, no visible light reflex, not able to view ossicles. No perforation.  Tenderness just below ear to palpation. No tenderness over mastoid bone.   Eyes: Conjunctivae are normal. Right eye exhibits no discharge. Left eye exhibits no discharge.  Neck:  Right swollen anterior cervical nodes.   Cardiovascular: Normal rate, regular rhythm and normal heart sounds.   Pulmonary/Chest: Effort normal and breath sounds normal. He has no wheezes.  Neurological: He is alert and oriented to person, place, and time.  Skin: Skin is dry.  Psychiatric: He has a normal mood and affect. His behavior is normal.          Assessment & Plan:  Right otitis externa and media, not resolved- it does seem the external infection is improving. Ear canal not has swollen. TM still looks very inflamed and buldging, reassured no perforation. toradol given 30mg  IM. Pt has hx of narcotic abuse will not give narcotic.  Auralgan drops for pain relief. Stop augmentin. Start bactrim on 4 dollar list. Pt does not have insurance. Stay on external ear drops. Prednisone for 5 days. Follow up in 3 days if not improving. Call with any worsening symptoms such as fever, chills.   Cough- reassured pt that lungs sounded great. Bactrim should also help. Consider mucinex 1200mg  bid.

## 2014-10-31 NOTE — Patient Instructions (Signed)

## 2015-01-23 ENCOUNTER — Ambulatory Visit: Payer: Self-pay | Admitting: Physician Assistant

## 2015-01-24 ENCOUNTER — Encounter: Payer: Self-pay | Admitting: Physician Assistant

## 2015-01-24 ENCOUNTER — Ambulatory Visit (INDEPENDENT_AMBULATORY_CARE_PROVIDER_SITE_OTHER): Payer: Self-pay | Admitting: Physician Assistant

## 2015-01-24 VITALS — BP 121/70 | HR 69 | Wt 203.0 lb

## 2015-01-24 DIAGNOSIS — F430 Acute stress reaction: Secondary | ICD-10-CM

## 2015-01-24 DIAGNOSIS — G2581 Restless legs syndrome: Secondary | ICD-10-CM

## 2015-01-24 DIAGNOSIS — R0781 Pleurodynia: Secondary | ICD-10-CM

## 2015-01-24 DIAGNOSIS — F419 Anxiety disorder, unspecified: Secondary | ICD-10-CM

## 2015-01-24 DIAGNOSIS — F411 Generalized anxiety disorder: Secondary | ICD-10-CM | POA: Insufficient documentation

## 2015-01-24 MED ORDER — CYCLOBENZAPRINE HCL 10 MG PO TABS: 10.0000 mg | ORAL_TABLET | Freq: Three times a day (TID) | ORAL | Status: DC | PRN

## 2015-01-24 MED ORDER — LORAZEPAM 0.5 MG PO TABS: 0.5000 mg | ORAL_TABLET | Freq: Two times a day (BID) | ORAL | Status: DC | PRN

## 2015-01-24 MED ORDER — KETOROLAC TROMETHAMINE 60 MG/2ML IM SOLN
60.0000 mg | Freq: Once | INTRAMUSCULAR | Status: AC
  Administered 2015-01-24: 60 mg via INTRAMUSCULAR

## 2015-01-24 MED ORDER — GABAPENTIN 100 MG PO CAPS: ORAL_CAPSULE | ORAL | Status: DC

## 2015-01-24 NOTE — Patient Instructions (Signed)
pennsaid twice a day.   Restless Legs Syndrome Restless legs syndrome is a movement disorder. It may also be called a sensorimotor disorder.  CAUSES  No one knows what specifically causes restless legs syndrome, but it tends to run in families. It is also more common in people with low iron, in pregnancy, in people who need dialysis, and those with nerve damage (neuropathy).Some medications may make restless legs syndrome worse.Those medications include drugs to treat high blood pressure, some heart conditions, nausea, colds, allergies, and depression. SYMPTOMS Symptoms include uncomfortable sensations in the legs. These leg sensations are worse during periods of inactivity or rest. They are also worse while sitting or lying down. Individuals that have the disorder describe sensations in the legs that feel like:  Pulling.  Drawing.  Crawling.  Worming.  Boring.  Tingling.  Pins and needles.  Prickling.  Pain. The sensations are usually accompanied by an overwhelming urge to move the legs. Sudden muscle jerks may also occur. Movement provides temporary relief from the discomfort. In rare cases, the arms may also be affected. Symptoms may interfere with going to sleep (sleep onset insomnia). Restless legs syndrome may also be related to periodic limb movement disorder (PLMD). PLMD is another more common motor disorder. It also causes interrupted sleep. The symptoms from PLMD usually occur most often when you are awake. TREATMENT  Treatment for restless legs syndrome is symptomatic. This means that the symptoms are treated.   Massage and cold compresses may provide temporary relief.  Walk, stretch, or take a cold or hot bath.  Get regular exercise and a good night's sleep.  Avoid caffeine, alcohol, nicotine, and medications that can make it worse.  Do activities that provide mental stimulation like discussions, needlework, and video games. These may be helpful if you are not able  to walk or stretch. Some medications are effective in relieving the symptoms. However, many of these medications have side effects. Ask your caregiver about medications that may help your symptoms. Correcting iron deficiency may improve symptoms for some patients. Document Released: 10/10/2002 Document Revised: 03/06/2014 Document Reviewed: 01/16/2011 [facility] InstituteExitCare Patient Information 2015 [place] HamptonExitCare, MarylandLLC. This information is not intended to replace advice given to you by your health care provider. Make sure you discuss any questions you have with your health care provider.

## 2015-01-24 NOTE — Progress Notes (Signed)
   Subjective:    Patient ID: Theodore Walker, male    DOB: August 07, 1986, 29 y.o.   MRN: [id number]  HPI  Pt presents to the clinic with multiple concerns today. He does not have insurance so would like to address all issues.   Anxiety has increased over last 2 months. 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 Most of his anxiety surfaces at bedtime or in the middle of the night he will wake up in panic. Never been on anything daily for anxiety or depression. His grandmother also passed away a couple of months ago.   His restless leg and legs jumping at night has worsened. He has had this before when coming off narcotic but never just to have. Would like something to help. Never tried medication.   2 1/2 days ago a large cement mixer at work ran into his right shoulder and pushed him against the wall. He is very sore. No SOB or cough. No difficultly breathing.       Review of Systems  All other systems reviewed and are negative.      Objective:   Physical Exam  Constitutional: He is oriented to person, place, and time. He appears well-developed and well-nourished.  HENT:  Head: Normocephalic and atraumatic.  Cardiovascular: Normal rate, regular rhythm and normal heart sounds.   Pulmonary/Chest: Effort normal and breath sounds normal.  Musculoskeletal:  Tenderness over upper right ribs around axilla. No bruising or swelling.   Full ROM of right shoulder. No tenderness over bony prominences. Strength of right upper extremity 5/5.   Neurological: He is alert and oriented to person, place, and time.  Psychiatric: He has a normal mood and affect. His behavior is normal.          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 Discussed daily SSRI. Does not want to start. Just wants something to take for acute anxiety when he gets it. I gave ativan but had a long discussion about abuse potential and that with his history  would be watching him even closer for compliance. If using regularly will need to discussed daily anxiety medication.   RLS- neurontin with taper up to 300mg  at bedtime given. HO on prevenatative. Anxiety and stress could certainly be cuasing some of these symptoms.   Right rib pain- certainly could be broken rib. Pt declines xray. Gave samples of pennsaid, cool compresses. Encouraged ibuprofen up to 3 times a day as needed for next couple of days. Toradol 60mg  IM given in office today.  Did give muscle relaxer. No narcotics given.

## 2015-01-26 DIAGNOSIS — R0781 Pleurodynia: Secondary | ICD-10-CM | POA: Insufficient documentation

## 2015-01-26 MED ORDER — DICLOFENAC SODIUM 2 % TD SOLN: TRANSDERMAL | Status: DC

## 2015-01-30 ENCOUNTER — Telehealth: Payer: Self-pay | Admitting: Physician Assistant

## 2015-01-30 NOTE — Telephone Encounter (Signed)
Advised Pt that no narcotics will be written. Pt states he has not gotten the x-ray yet because he does not have insurance and can not afford it out of pocket.

## 2015-01-30 NOTE — Telephone Encounter (Signed)
Pt called to state the Rx given and injection administered are not helping his shoulder pain. Please advise if stronger Rx is available of if Pt needs to be seen again. Thank you.

## 2015-01-30 NOTE — Telephone Encounter (Signed)
We don't want to go narcotic route. Continue to use pennsaid and add mobic. If pain continues need to consider xrays.

## 2015-03-20 ENCOUNTER — Other Ambulatory Visit: Payer: Self-pay

## 2015-03-20 MED ORDER — LORAZEPAM 0.5 MG PO TABS: 0.5000 mg | ORAL_TABLET | Freq: Two times a day (BID) | ORAL | Status: DC | PRN

## 2015-03-23 ENCOUNTER — Other Ambulatory Visit: Payer: Self-pay | Admitting: Physician Assistant

## 2015-03-28 ENCOUNTER — Other Ambulatory Visit: Payer: Self-pay | Admitting: Physician Assistant

## 2015-03-30 ENCOUNTER — Encounter: Payer: Self-pay | Admitting: Family Medicine

## 2015-03-30 ENCOUNTER — Ambulatory Visit (INDEPENDENT_AMBULATORY_CARE_PROVIDER_SITE_OTHER): Payer: Self-pay | Admitting: Family Medicine

## 2015-03-30 VITALS — BP 135/81 | HR 88 | Wt 193.0 lb

## 2015-03-30 DIAGNOSIS — F418 Other specified anxiety disorders: Secondary | ICD-10-CM
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
## 2015-04-01 ENCOUNTER — Encounter: Payer: Self-pay | Admitting: Family Medicine

## 2015-08-29 ENCOUNTER — Emergency Department
Admission: EM | Admit: 2015-08-29 | Discharge: 2015-08-29 | Disposition: A | Discharge status: Home / Self Care | Payer: Self-pay | Attending: Emergency Medicine | Admitting: Emergency Medicine

## 2015-08-29 ENCOUNTER — Emergency Department: Payer: Self-pay

## 2015-08-29 ENCOUNTER — Encounter: Payer: Self-pay | Admitting: Emergency Medicine

## 2015-08-29 DIAGNOSIS — W2209XA Striking against other stationary object, initial encounter: Secondary | ICD-10-CM | POA: Insufficient documentation

## 2015-08-29 DIAGNOSIS — Y9289 Other specified places as the place of occurrence of the external cause: Secondary | ICD-10-CM | POA: Insufficient documentation

## 2015-08-29 DIAGNOSIS — S46912A Strain of unspecified muscle, fascia and tendon at shoulder and upper arm level, left arm, initial encounter: Secondary | ICD-10-CM | POA: Insufficient documentation

## 2015-08-29 DIAGNOSIS — Y9389 Activity, other specified: Secondary | ICD-10-CM | POA: Insufficient documentation

## 2015-08-29 DIAGNOSIS — Y998 Other external cause status: Secondary | ICD-10-CM | POA: Insufficient documentation

## 2015-08-29 NOTE — Discharge Instructions (Signed)
As we discussed, your workup today was reassuring.  The x-rays of your left shoulder are normal today with no sign of fracture or dislocation.  Please read through the included information about conservative measures to help her discomfort and take over-the-counter pain medication as needed.  Use your sling for comfort but no more than needed, because continuing to wear it may make her shoulder actually become more stiff and sore.  Follow-up with Dr. Rosita KeaMenz or one of his orthopedics colleagues if you need additional care with your chronic shoulder injuries.  Please return immediately to the Emergency Department if you develop any new or worsening symptoms that concern you.

## 2015-08-29 NOTE — ED Provider Notes (Signed)
[facility] Emergency Department Provider Note  ____________________________________________  Time seen: Approximately 2:09 PM  I have reviewed the triage vital signs and the nursing notes.   HISTORY  Chief Complaint Shoulder Pain    HPI Theodore Walker is a 29 y.o. male with past medical history that includes GERD, IBS," spastic colon", and reportedly having injured his shoulder multiple times previously presents with pain in his left shoulder after what he describes as a dislocation 2 days ago.  He states that because of multiple prior injuries to shoulder is very loose.  He was getting in the shower and slipped and caught himself and feels that he dislocated his left shoulder at that time.  He reports that he "banged it against a wall" and popped it back in, but is continued to cause him pain and discomfort since that time.  He describes the pain as severe with movementand mild at rest.  Nothing makes it better other than rest.  The pain is sharp and located in his left shoulder.  He did not strike his head and has no headache or neck pain.  He denies any other symptoms specifically including nausea/vomiting, chest pain, shortness of breath, and abdominal pain.  The pain began 2 days ago and has been persistent.  He denies any numbness or tingling of the arm or hand below the affected shoulder.   Past Medical History  Diagnosis Date  . GERD (gastroesophageal reflux disease)   . IBS (irritable bowel syndrome)   . Hiatal hernia   . Spastic colon   . Appendicitis   . GERD (gastroesophageal reflux disease)     Patient Active Problem List   Diagnosis Date Noted  . Rib pain on right side 01/26/2015  . Anxiety in acute stress reaction 01/24/2015  . RLS (restless legs syndrome) 01/24/2015  . Narcotic abuse in remission 03/13/2014  . Patellofemoral syndrome, left 09/23/2013  . Lumbar degenerative disc disease 07/07/2013    Past Surgical History  Procedure  Laterality Date  . Knee surgery    . Nose surgery    . Vasectomy    . Appendectomy      No current outpatient prescriptions on file.  Allergies Morphine and related  Family History  Problem Relation Age of Onset  . Hypertension Mother   . Diabetes Mother     Social History Social History  Substance Use Topics  . Smoking status: Never Smoker   . Smokeless tobacco: Current User    Types: Chew  . Alcohol Use: No    Review of Systems Constitutional: No fever/chills Cardiovascular: Denies chest pain. Respiratory: Denies shortness of breath. Gastrointestinal: No abdominal pain.  No nausea, no vomiting.  No diarrhea.   Musculoskeletal: Sharp pain with movement of left shoulder. Skin: Negative for rash. Neurological: Negative for headaches, focal weakness or numbness.  ____________________________________________   PHYSICAL EXAM:  VITAL SIGNS: ED Triage Vitals  Enc Vitals Group     BP 08/29/15 1302 145/69 mmHg     Pulse Rate 08/29/15 1302 86     Resp 08/29/15 1302 16     Temp 08/29/15 1302 98.1 F (36.7 C)     Temp Source 08/29/15 1302 Oral     SpO2 08/29/15 1302 100 %     Weight 08/29/15 1302 165 lb (74.844 kg)     Height 08/29/15 1302  (1.905 m)     Head Cir --      Peak Flow --      Pain  Score 08/29/15 1303 9     Pain Loc --      Pain Edu? --      Excl. in GC? --     Constitutional: Alert and oriented. Well appearing and in no acute distress. Eyes: Conjunctivae are normal. PERRL. EOMI. Head: Atraumatic. Nose: No congestion/rhinnorhea. Neck: No stridor.  No cervical spine tenderness to palpation. Cardiovascular: Normal rate, regular rhythm. Good peripheral circulation. Respiratory: Normal respiratory effort.  No retractions. Lungs CTAB. Musculoskeletal: The patient is nearly able to grasp his right shoulder with his left hand, although the full range of motion is limited by pain.  However, he does seem to have range of motion in spite of it being  tender (does not limited as if there was a dislocation).  He is neurovascularly intact throughout the left upper extremity.  His musculoskeletal exam is otherwise unremarkable. Neurologic:  Normal speech and language. No gross focal neurologic deficits are appreciated.  Skin:  Skin is warm, dry and intact. No rash noted. Psychiatric: Mood and affect are normal. Speech and behavior are normal.  ____________________________________________   LABS (all labs ordered are listed, but only abnormal results are displayed)  Labs Reviewed - No data to display ____________________________________________  EKG  Not indicated ____________________________________________  RADIOLOGY I, Asuka Dusseau, personally viewed and evaluated these images (plain radiographs) as part of my medical decision making.  Dg Shoulder Left  08/29/2015  CLINICAL DATA:  Multiple dislocations. EXAM: LEFT SHOULDER - 2+ VIEW COMPARISON:  None available FINDINGS: External artifact about the left shoulder. Normal alignment without subluxation or dislocation. No fracture. Preserved joint spaces. No significant arthropathy. IMPRESSION: No acute finding. Electronically Signed   By: Judie PetitM.  Shick M.D.   On: 08/29/2015 14:11    ____________________________________________   PROCEDURES  Procedure(s) performed: None  Critical Care performed: No ____________________________________________   INITIAL IMPRESSION / ASSESSMENT AND PLAN / ED COURSE  Pertinent labs & imaging results that were available during my care of the patient were reviewed by me and considered in my medical decision making (see chart for details).  I reviewed the patient's past medical records is somewhat confusing.  I looked back to 2014 where he states he originally had an injury that caused repeated problems to the shoulder.  Prior clinic notes and x-rays obtained since 2014 I will refer to injuries to the right shoulder and repeated dislocations of that  extremity, not on the left side.  I asked the patient about this and he seemed puzzled because he states he is left-hand dominant and that is always been a problem.  However, I find no prior records of injuries to his left shoulder since 2014.  The patient's x-rays are unremarkable today and his physical exam is reassuring.  I gave him my usual and customary return precautions and follow-up recommendations for shoulder strain, placed him in a sling, and recommended over-the-counter pain medication.  Review of the [place] controlled substance database indicates that he has had no controlled substances filled in the last 6 months, but in the 5 months prior he had 8 prescriptions for narcotics and/or benzodiazepine's filled by 7 different providers.  I am recommending over-the-counter and conservative measures to treat his discomfort.  ____________________________________________  FINAL CLINICAL IMPRESSION(S) / ED DIAGNOSES  Final diagnoses:  Left shoulder strain, initial encounter      NEW MEDICATIONS STARTED DURING THIS VISIT:  There are no discharge medications for this patient.    Loleta Roseory Tamakia Porto, MD 08/29/15 2052

## 2015-08-29 NOTE — ED Notes (Signed)
Pt reports dislocating shoulder on Sunday, reports he was able to get it back in place, reports he dislocates it quite often. Pt reports pain continues, feels like a bone is sticking up.

## 2016-04-27 ENCOUNTER — Encounter: Payer: Self-pay | Admitting: Emergency Medicine

## 2016-04-27 ENCOUNTER — Emergency Department
Admission: EM | Admit: 2016-04-27 | Discharge: 2016-04-27 | Disposition: A | Discharge status: Home / Self Care | Payer: BLUE CROSS/BLUE SHIELD | Attending: Emergency Medicine | Admitting: Emergency Medicine

## 2016-04-27 ENCOUNTER — Emergency Department: Payer: BLUE CROSS/BLUE SHIELD

## 2016-04-27 ENCOUNTER — Emergency Department
Admission: EM | Admit: 2016-04-27 | Discharge: 2016-04-27 | Disposition: A | Discharge status: Home / Self Care | Payer: BLUE CROSS/BLUE SHIELD | Source: Home / Self Care

## 2016-04-27 DIAGNOSIS — Z8719 Personal history of other diseases of the digestive system: Secondary | ICD-10-CM | POA: Insufficient documentation

## 2016-04-27 DIAGNOSIS — F17220 Nicotine dependence, chewing tobacco, uncomplicated: Secondary | ICD-10-CM | POA: Insufficient documentation

## 2016-04-27 DIAGNOSIS — S022XXA Fracture of nasal bones, initial encounter for closed fracture: Secondary | ICD-10-CM | POA: Insufficient documentation

## 2016-04-27 DIAGNOSIS — Y999 Unspecified external cause status: Secondary | ICD-10-CM | POA: Diagnosis not present

## 2016-04-27 DIAGNOSIS — S8991XA Unspecified injury of right lower leg, initial encounter: Secondary | ICD-10-CM | POA: Diagnosis not present

## 2016-04-27 DIAGNOSIS — Y9389 Activity, other specified: Secondary | ICD-10-CM | POA: Insufficient documentation

## 2016-04-27 DIAGNOSIS — Y929 Unspecified place or not applicable: Secondary | ICD-10-CM | POA: Insufficient documentation

## 2016-04-27 DIAGNOSIS — M5136 Other intervertebral disc degeneration, lumbar region: Secondary | ICD-10-CM | POA: Diagnosis not present

## 2016-04-27 DIAGNOSIS — Z5321 Procedure and treatment not carried out due to patient leaving prior to being seen by health care provider: Secondary | ICD-10-CM | POA: Insufficient documentation

## 2016-04-27 DIAGNOSIS — S0992XA Unspecified injury of nose, initial encounter: Secondary | ICD-10-CM | POA: Diagnosis present

## 2016-04-27 MED ORDER — ONDANSETRON HCL 4 MG/2ML IJ SOLN
4.0000 mg | Freq: Once | INTRAMUSCULAR | Status: AC
  Administered 2016-04-27: 4 mg via INTRAVENOUS
  Filled 2016-04-27: qty 2

## 2016-04-27 MED ORDER — KETOROLAC TROMETHAMINE 30 MG/ML IJ SOLN
30.0000 mg | Freq: Once | INTRAMUSCULAR | Status: AC
  Administered 2016-04-27: 30 mg via INTRAVENOUS
  Filled 2016-04-27: qty 1

## 2016-04-27 MED ORDER — HYDROMORPHONE HCL 2 MG PO TABS: 2.0000 mg | ORAL_TABLET | Freq: Two times a day (BID) | ORAL | Status: AC | PRN

## 2016-04-27 MED ORDER — HYDROMORPHONE HCL 1 MG/ML IJ SOLN
1.0000 mg | Freq: Once | INTRAMUSCULAR | Status: AC
  Administered 2016-04-27: 1 mg via INTRAVENOUS
  Filled 2016-04-27: qty 1

## 2016-04-27 MED ORDER — CEPHALEXIN 500 MG PO CAPS: 500.0000 mg | ORAL_CAPSULE | Freq: Four times a day (QID) | ORAL | Status: AC

## 2016-04-27 MED ORDER — SODIUM CHLORIDE 0.9 % IV BOLUS (SEPSIS)
500.0000 mL | Freq: Once | INTRAVENOUS | Status: AC
Start: 2016-04-27 — End: 2016-04-27
  Administered 2016-04-27: 500 mL via INTRAVENOUS
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
## 2016-04-27 NOTE — Discharge Instructions (Signed)
Nasal Fracture A nasal fracture is a break or crack in the bones or cartilage of the nose. Minor breaks do not require treatment. These breaks usually heal on their own after about one month. Serious breaks may require surgery. CAUSES This injury is usually caused by a blunt injury to the nose. This type of injury often occurs from:  Contact sports.  Car accidents.  Falls.  Getting punched. SYMPTOMS Symptoms of this injury include:  Pain.  Swelling of the nose.  Bleeding from the nose.  Bruising around the nose or eyes. This may include having black eyes.  Crooked appearance of the nose. DIAGNOSIS This injury may be diagnosed with a physical exam. The health care provider will gently feel the nose for signs of broken bones. He or she will look inside the nostrils to make sure that there is not a blood-filled swelling on the dividing wall between the nostrils (septal hematoma). X-rays of the nose may not show a nasal fracture even when one is present. In some cases, X-rays or a CT scan may be done 1-5 days after the injury. Sometimes, the health care provider will want to wait until the swelling has gone down. TREATMENT Often, minor fractures that have caused no deformity do not require treatment. More serious fractures in which bones have moved out of position may require surgery, which will take place after the swelling is gone. Surgery will stabilize and align the fracture. In some cases, a health care provider may be able to reposition the bones without surgery. This may be done in the health care provider's office after medicine is given to numb the area (local anesthetic). HOME CARE INSTRUCTIONS  If directed, apply ice to the injured area:  Put ice in a plastic bag.  Place a towel between your skin and the bag.  Leave the ice on for 20 minutes, 2-3 times per day.  Take over-the-counter and prescription medicines only as told by your health care provider.  If your nose  starts to bleed, sit in an upright position while you squeeze the soft parts of your nose against the dividing wall between your nostrils (septum) for 10 minutes.  Try to avoid blowing your nose.  Return to your normal activities as told by your health care provider. Ask your health care provider what activities are safe for you.  Avoid contact sports for 3-4 weeks or as told by your health care provider.  Keep all follow-up visits as told by your health care provider. This is important. SEEK MEDICAL CARE IF:  Your pain increases or becomes severe.  You continue to have nosebleeds.  The shape of your nose does not return to normal within 5 days.  You have pus draining out of your nose. SEEK IMMEDIATE MEDICAL CARE IF:  You have bleeding from your nose that does not stop after you pinch your nostrils closed for 20 minutes and keep ice on your nose.  You have clear fluid draining out of your nose.  You notice a grape-like swelling on the septum. This swelling is a collection of blood (hematoma) that must be drained to help prevent infection.  You have difficulty moving your eyes.  You have repeated vomiting.   This information is not intended to replace advice given to you by your health care provider. Make sure you discuss any questions you have with your health care provider.   Document Released: 10/17/2000 Document Revised: 07/11/2015 Document Reviewed: 11/27/2014 Elsevier Interactive Patient Education [employer].  Rest, ice, and elevate the right knee. Apply ice to areas of discomfort and swelling especially across the bridge of the nose The occiput etc. Please call ENT and orthopedic surgeon for follow-up for nasal fracture and knee injury respectively. Continue with ibuprofen and/or Tylenol for pain over-the-counter.

## 2016-06-02 ENCOUNTER — Other Ambulatory Visit: Payer: Self-pay | Admitting: Student

## 2016-06-02 DIAGNOSIS — M25561 Pain in right knee: Secondary | ICD-10-CM

## 2016-06-14 ENCOUNTER — Ambulatory Visit
Admission: RE | Admit: 2016-06-14 | Discharge: 2016-06-14 | Disposition: A | Discharge status: Home / Self Care | Payer: BLUE CROSS/BLUE SHIELD | Source: Ambulatory Visit | Attending: Student | Admitting: Student

## 2016-06-14 DIAGNOSIS — M25561 Pain in right knee: Secondary | ICD-10-CM | POA: Diagnosis present

## 2016-06-14 DIAGNOSIS — R938 Abnormal findings on diagnostic imaging of other specified body structures: Secondary | ICD-10-CM | POA: Diagnosis not present

## 2016-06-14 DIAGNOSIS — M25461 Effusion, right knee: Secondary | ICD-10-CM | POA: Insufficient documentation

## 2016-06-14 DIAGNOSIS — M899 Disorder of bone, unspecified: Secondary | ICD-10-CM | POA: Diagnosis not present

## 2016-06-14 DIAGNOSIS — M23631 Other spontaneous disruption of medial collateral ligament of right knee: Secondary | ICD-10-CM | POA: Diagnosis not present
